# Patient Record
Sex: Female | Born: 1975 | Race: White | Hispanic: No | Marital: Married | Smoking: Former smoker
Health system: Southern US, Community
[De-identification: ages and names within clinical notes are randomized; demographics above are authoritative.]

## PROBLEM LIST (undated history)

## (undated) DIAGNOSIS — J45909 Unspecified asthma, uncomplicated: Secondary | ICD-10-CM

## (undated) HISTORY — PX: ANKLE SURGERY: SHX546

---

## 2001-07-11 ENCOUNTER — Other Ambulatory Visit: Admission: RE | Admit: 2001-07-11 | Discharge: 2001-07-11 | Payer: Self-pay | Admitting: *Deleted

## 2003-10-21 ENCOUNTER — Other Ambulatory Visit: Admission: RE | Admit: 2003-10-21 | Discharge: 2003-10-21 | Payer: Self-pay | Admitting: Gynecology

## 2005-04-28 ENCOUNTER — Emergency Department: Payer: Self-pay | Admitting: Emergency Medicine

## 2006-03-10 ENCOUNTER — Inpatient Hospital Stay (HOSPITAL_COMMUNITY): Admission: AD | Admit: 2006-03-10 | Discharge: 2006-03-12 | Payer: Self-pay | Admitting: *Deleted

## 2009-09-11 ENCOUNTER — Ambulatory Visit: Payer: Self-pay | Admitting: Specialist

## 2009-09-18 ENCOUNTER — Ambulatory Visit: Payer: Self-pay | Admitting: Specialist

## 2015-06-16 ENCOUNTER — Encounter: Payer: Self-pay | Admitting: Emergency Medicine

## 2015-06-16 ENCOUNTER — Emergency Department: Payer: BLUE CROSS/BLUE SHIELD

## 2015-06-16 ENCOUNTER — Inpatient Hospital Stay: Payer: BLUE CROSS/BLUE SHIELD

## 2015-06-16 ENCOUNTER — Inpatient Hospital Stay
Admission: EM | Admit: 2015-06-16 | Discharge: 2015-06-19 | DRG: 190 | Disposition: A | Discharge status: Home Health | Payer: BLUE CROSS/BLUE SHIELD | Attending: Internal Medicine | Admitting: Internal Medicine

## 2015-06-16 DIAGNOSIS — R0602 Shortness of breath: Secondary | ICD-10-CM

## 2015-06-16 DIAGNOSIS — J440 Chronic obstructive pulmonary disease with acute lower respiratory infection: Principal | ICD-10-CM | POA: Diagnosis present

## 2015-06-16 DIAGNOSIS — J441 Chronic obstructive pulmonary disease with (acute) exacerbation: Secondary | ICD-10-CM | POA: Diagnosis present

## 2015-06-16 DIAGNOSIS — Z794 Long term (current) use of insulin: Secondary | ICD-10-CM

## 2015-06-16 DIAGNOSIS — J9601 Acute respiratory failure with hypoxia: Secondary | ICD-10-CM | POA: Diagnosis present

## 2015-06-16 DIAGNOSIS — F17210 Nicotine dependence, cigarettes, uncomplicated: Secondary | ICD-10-CM | POA: Diagnosis present

## 2015-06-16 DIAGNOSIS — Z23 Encounter for immunization: Secondary | ICD-10-CM | POA: Diagnosis not present

## 2015-06-16 DIAGNOSIS — J690 Pneumonitis due to inhalation of food and vomit: Secondary | ICD-10-CM

## 2015-06-16 DIAGNOSIS — J209 Acute bronchitis, unspecified: Secondary | ICD-10-CM | POA: Diagnosis present

## 2015-06-16 DIAGNOSIS — J9600 Acute respiratory failure, unspecified whether with hypoxia or hypercapnia: Secondary | ICD-10-CM | POA: Diagnosis present

## 2015-06-16 DIAGNOSIS — J45909 Unspecified asthma, uncomplicated: Secondary | ICD-10-CM | POA: Diagnosis present

## 2015-06-16 DIAGNOSIS — J189 Pneumonia, unspecified organism: Secondary | ICD-10-CM | POA: Diagnosis present

## 2015-06-16 DIAGNOSIS — Z79899 Other long term (current) drug therapy: Secondary | ICD-10-CM | POA: Diagnosis not present

## 2015-06-16 DIAGNOSIS — E1165 Type 2 diabetes mellitus with hyperglycemia: Secondary | ICD-10-CM | POA: Diagnosis present

## 2015-06-16 HISTORY — DX: Unspecified asthma, uncomplicated: J45.909

## 2015-06-16 LAB — BASIC METABOLIC PANEL
Anion gap: 12 (ref 5–15)
BUN: 9 mg/dL (ref 6–20)
CALCIUM: 9.1 mg/dL (ref 8.9–10.3)
CO2: 24 mmol/L (ref 22–32)
CREATININE: 0.47 mg/dL (ref 0.44–1.00)
Chloride: 102 mmol/L (ref 101–111)
GFR calc non Af Amer: >60 mL/min (ref >60)
Glucose, Bld: 256 mg/dL — ABNORMAL HIGH (ref 65–99)
Potassium: 3.6 mmol/L (ref 3.5–5.1)
SODIUM: 138 mmol/L (ref 135–145)

## 2015-06-16 LAB — BLOOD GAS, ARTERIAL
Acid-base deficit: 0 mmol/L (ref 0.0–2.0)
Allens test (pass/fail): POSITIVE — AB
Bicarbonate: 23.9 mEq/L (ref 21.0–28.0)
FIO2: 0.44
O2 SAT: 92.4 %
PATIENT TEMPERATURE: 37
PCO2 ART: 36 mmHg (ref 32.0–48.0)
pH, Arterial: 7.43 (ref 7.350–7.450)
pO2, Arterial: 63 mmHg — ABNORMAL LOW (ref 83.0–108.0)

## 2015-06-16 LAB — CBC
HCT: 42.7 % (ref 35.0–47.0)
Hemoglobin: 14.5 g/dL (ref 12.0–16.0)
MCH: 29.8 pg (ref 26.0–34.0)
MCHC: 33.9 g/dL (ref 32.0–36.0)
MCV: 87.9 fL (ref 80.0–100.0)
PLATELETS: 248 10*3/uL (ref 150–440)
RBC: 4.86 MIL/uL (ref 3.80–5.20)
RDW: 12.7 % (ref 11.5–14.5)
WBC: 10.9 10*3/uL (ref 3.6–11.0)

## 2015-06-16 LAB — GLUCOSE, CAPILLARY: Glucose-Capillary: 305 mg/dL — ABNORMAL HIGH (ref 65–99)

## 2015-06-16 LAB — INFLUENZA PANEL BY PCR (TYPE A & B)
H1N1FLUPCR: NOT DETECTED
INFLAPCR: NEGATIVE
Influenza B By PCR: NEGATIVE

## 2015-06-16 LAB — TROPONIN I

## 2015-06-16 LAB — TSH: TSH: 1.696 u[IU]/mL (ref 0.350–4.500)

## 2015-06-16 LAB — MRSA PCR SCREENING: MRSA BY PCR: NEGATIVE

## 2015-06-16 MED ORDER — GUAIFENESIN ER 600 MG PO TB12
600.0000 mg | ORAL_TABLET | Freq: Two times a day (BID) | ORAL | Status: DC
  Administered 2015-06-16 – 2015-06-19 (×7): 600 mg via ORAL
  Filled 2015-06-16 (×7): qty 1

## 2015-06-16 MED ORDER — ONDANSETRON HCL 4 MG PO TABS: 4.0000 mg | ORAL_TABLET | Freq: Four times a day (QID) | ORAL | Status: DC | PRN

## 2015-06-16 MED ORDER — IPRATROPIUM-ALBUTEROL 0.5-2.5 (3) MG/3ML IN SOLN
3.0000 mL | Freq: Once | RESPIRATORY_TRACT | Status: AC
  Administered 2015-06-16: 3 mL via RESPIRATORY_TRACT
  Filled 2015-06-16: qty 3

## 2015-06-16 MED ORDER — ACETAMINOPHEN 325 MG PO TABS: 650.0000 mg | ORAL_TABLET | Freq: Four times a day (QID) | ORAL | Status: DC | PRN

## 2015-06-16 MED ORDER — DEXTROSE 5 % IV SOLN
1.0000 g | Freq: Once | INTRAVENOUS | Status: AC
  Administered 2015-06-16: 1 g via INTRAVENOUS
  Filled 2015-06-16: qty 10

## 2015-06-16 MED ORDER — ACETAMINOPHEN 650 MG RE SUPP: 650.0000 mg | Freq: Four times a day (QID) | RECTAL | Status: DC | PRN

## 2015-06-16 MED ORDER — MOMETASONE FURO-FORMOTEROL FUM 100-5 MCG/ACT IN AERO
2.0000 | INHALATION_SPRAY | Freq: Two times a day (BID) | RESPIRATORY_TRACT | Status: DC
  Administered 2015-06-16 – 2015-06-19 (×7): 2 via RESPIRATORY_TRACT
  Filled 2015-06-16: qty 8.8

## 2015-06-16 MED ORDER — HYDROCODONE-ACETAMINOPHEN 5-325 MG PO TABS: 1.0000 | ORAL_TABLET | ORAL | Status: DC | PRN

## 2015-06-16 MED ORDER — ACETYLCYSTEINE 20 % IN SOLN: 4.0000 mL | Freq: Two times a day (BID) | RESPIRATORY_TRACT | Status: DC

## 2015-06-16 MED ORDER — NICOTINE 10 MG IN INHA
1.0000 | RESPIRATORY_TRACT | Status: DC | PRN
  Filled 2015-06-16: qty 36

## 2015-06-16 MED ORDER — TIOTROPIUM BROMIDE MONOHYDRATE 18 MCG IN CAPS
18.0000 ug | ORAL_CAPSULE | Freq: Every day | RESPIRATORY_TRACT | Status: DC
  Administered 2015-06-17: 18 ug via RESPIRATORY_TRACT
  Filled 2015-06-16: qty 5

## 2015-06-16 MED ORDER — DEXTROSE 5 % IV SOLN
1.0000 g | INTRAVENOUS | Status: DC
  Filled 2015-06-16: qty 10

## 2015-06-16 MED ORDER — ALBUTEROL SULFATE (2.5 MG/3ML) 0.083% IN NEBU
5.0000 mg | INHALATION_SOLUTION | Freq: Once | RESPIRATORY_TRACT | Status: AC
  Administered 2015-06-16: 5 mg via RESPIRATORY_TRACT
  Filled 2015-06-16: qty 6

## 2015-06-16 MED ORDER — SODIUM CHLORIDE 0.9 % IV SOLN
INTRAVENOUS | Status: DC
  Administered 2015-06-16: 75 mL/h via INTRAVENOUS
  Administered 2015-06-17: 08:00:00 via INTRAVENOUS

## 2015-06-16 MED ORDER — ENOXAPARIN SODIUM 40 MG/0.4ML ~~LOC~~ SOLN
40.0000 mg | SUBCUTANEOUS | Status: DC
  Administered 2015-06-16 – 2015-06-18 (×3): 40 mg via SUBCUTANEOUS
  Filled 2015-06-16 (×3): qty 0.4

## 2015-06-16 MED ORDER — INFLUENZA VAC SPLIT QUAD 0.5 ML IM SUSY
0.5000 mL | PREFILLED_SYRINGE | INTRAMUSCULAR | Status: AC
  Administered 2015-06-17: 0.5 mL via INTRAMUSCULAR
  Filled 2015-06-16: qty 0.5

## 2015-06-16 MED ORDER — METHYLPREDNISOLONE SODIUM SUCC 125 MG IJ SOLR
125.0000 mg | Freq: Once | INTRAMUSCULAR | Status: AC
  Administered 2015-06-16: 125 mg via INTRAVENOUS
  Filled 2015-06-16: qty 2

## 2015-06-16 MED ORDER — IOHEXOL 350 MG/ML SOLN
75.0000 mL | Freq: Once | INTRAVENOUS | Status: AC | PRN
  Administered 2015-06-16: 75 mL via INTRAVENOUS

## 2015-06-16 MED ORDER — GUAIFENESIN-DM 100-10 MG/5ML PO SYRP: 5.0000 mL | ORAL_SOLUTION | ORAL | Status: DC | PRN

## 2015-06-16 MED ORDER — LEVALBUTEROL HCL 0.63 MG/3ML IN NEBU
0.6300 mg | INHALATION_SOLUTION | Freq: Four times a day (QID) | RESPIRATORY_TRACT | Status: DC
  Administered 2015-06-16 – 2015-06-19 (×12): 0.63 mg via RESPIRATORY_TRACT
  Filled 2015-06-16 (×16): qty 3

## 2015-06-16 MED ORDER — AZITHROMYCIN 250 MG PO TABS
500.0000 mg | ORAL_TABLET | Freq: Once | ORAL | Status: AC
  Administered 2015-06-16: 500 mg via ORAL
  Filled 2015-06-16: qty 2

## 2015-06-16 MED ORDER — AZITHROMYCIN 250 MG PO TABS
500.0000 mg | ORAL_TABLET | Freq: Every day | ORAL | Status: DC
  Administered 2015-06-17 – 2015-06-19 (×3): 500 mg via ORAL
  Filled 2015-06-16 (×3): qty 2

## 2015-06-16 MED ORDER — METHYLPREDNISOLONE SODIUM SUCC 125 MG IJ SOLR
60.0000 mg | Freq: Four times a day (QID) | INTRAMUSCULAR | Status: DC
  Administered 2015-06-16 – 2015-06-17 (×3): 60 mg via INTRAVENOUS
  Filled 2015-06-16 (×4): qty 2

## 2015-06-16 MED ORDER — ONDANSETRON HCL 4 MG/2ML IJ SOLN: 4.0000 mg | Freq: Four times a day (QID) | INTRAMUSCULAR | Status: DC | PRN

## 2015-06-16 NOTE — Consult Note (Signed)
[facility] [place] [facility] Consultation     ASSESSMENT/PLAN    PULMONARY -Acute respiratory failure secondary to acute exacerbation of asthma. We will treat with IV steroids and inhaled nebulizer treatments. -Pneumonia. -Likely episode of acute bronchitis. We'll treat with azithromycin. -Likely element of allergies. Given the patient has a new dog, recommended that the PET be removed from the bedroom environment and the patient be started on an antihistamine. -We'll require an inhaled steroid at the time of discharge, flu vaccine, pneumococcal Pneumovax, and follow-up with the pulmonary.   INFECTIOUS -Likely element of acute bronchitis. P:   -We'll continue ceftriaxone and azithromycin, both started 11-1. BCx2 11/1: Pending Mycoplasma IgM: 11/1 Pending Streptococcal urinary antigen: 11/1 pending Influenza swab: 11/1. Pending Sputum pending    ---------------------------------------  ---------------------------------------   Name: Gina Guzman MRN: [id number] DOB: 1976-03-07    ADMISSION DATE:  06/16/2015 CONSULTATION DATE:  06/16/2015  REFERRING MD :  Dr. Allena Katz.   CHIEF COMPLAINT:  Dyspnea.    HISTORY OF PRESENT ILLNESS:    The patient is a 39 year old Caucasian female with a remote history of asthma several years ago but went away as an adult. The patient has a history of smoking. She smokes currently one pack a day. She is she has quit twice in the past when she was pregnant but restarted smoking again after she had had children. She had no problems with asthma since she was a child. The patient and noted approximately 2 months ago she started developing some dyspnea with cough and congestion. She presented to a physician and received a course of antibiotics, she noted that she had significant improvement, though she did not completely returned to baseline. The symptoms recurred again over the last 2-3 weeks and admitted. She was noting them to progress.  Subsequently she presented to the emergency room with dyspnea. She was started on 2 L nasal cannula and noted that her sats were still 89%, subsequently she was increased to 4 L with sats of 91%. It was noted that she was wheezing. She was then admitted to the intensive care unit due to respiratory distress, the patient was requiring 40-50% via Ventimask. Currently, she appears to be relatively comfortable. She has mild dyspnea, but is speaking in full sentences without conversational dyspnea. She notes that her breathing is still tight. She complains of no chest pain, she has had a history of right upper quadrant pain which she attributes to gallbladder issues, she says she has had this pain for approximately one year now with no particular exacerbating or relieving factors that she does think it is made worse by eating. She notes that the symptoms started probably 2 months ago when this occurred approximately 2-3 weeks after getting a new dog at home. They currently have 2 dogs at home. One is older and lives primarily in the downstairs area, a newer puppy is all over the house and sleeps in the bedroom and often in bed her.  The patient is also a smoker of 1 pack sig as per day. She notes no history of electronic cigarette use or other illicit drug use or other smoking behaviors.   PAST MEDICAL HISTORY :  Past Medical History  Diagnosis Date  . Asthma    Past Surgical History  Procedure Laterality Date  . Cesarean section      x2  . Ankle surgery Left    Prior to Admission medications   Medication Sig Start Date End Date Taking? Authorizing Provider  albuterol (PROAIR HFA) 108 (90 BASE) MCG/ACT inhaler Inhale 2 puffs into the lungs every 6 (six) hours as needed for wheezing.    Yes Historical Provider, MD  Pseudoephedrine-Naproxen Na 120-220 MG TB12 Take 1 tablet by mouth daily.   Yes Historical Provider, MD   No Known Allergies  FAMILY HISTORY:  Family History  Problem Relation Age of  Onset  . COPD Father    SOCIAL HISTORY:  reports that she has been smoking.  She does not have any smokeless tobacco history on file. She reports that she does not drink alcohol. Her drug history is not on file.  REVIEW OF SYSTEMS:   Constitutional: Feels well. Cardiovascular: No chest pain.  Pulmonary: Denies dyspnea.   The remainder of systems were reviewed and were found to be negative other than what is documented in the HPI.    VITAL SIGNS: Temp:  [97.8 F (36.6 C)] 97.8 F (36.6 C) (11/01 0835) Pulse Rate:  [94-109] 108 (11/01 1300) Resp:  [20-26] 21 (11/01 1300) BP: (116-144)/(70-90) 130/72 mmHg (11/01 1300) SpO2:  [84 %-89 %] 87 % (11/01 1300) Weight:  [94.348 kg (208 lb)] 94.348 kg (208 lb) (11/01 0835) HEMODYNAMICS:   VENTILATOR SETTINGS:   INTAKE / OUTPUT: No intake or output data in the 24 hours ending 06/16/15 1535  Physical Examination:   VS: BP 130/72 mmHg  Pulse 108  Temp(Src) 97.8 F (36.6 C) (Oral)  Resp 21  Ht 5\' 8"  (1.727 m)  Wt 94.348 kg (208 lb)  BMI 31.63 kg/m2  SpO2 87%  LMP  (LMP Unknown)  General Appearance: No distress  Neuro:without focal findings, mental status, speech normal, alert and oriented, cranial nerves 2-12 intact, reflexes normal and symmetric, sensation grossly normal  HEENT: PERRLA, EOM intact, no ptosis, no other lesions noticed;  Pulmonary: normal breath sounds., diaphragmatic excursion normal.No wheezing, No rales;     CardiovascularNormal S1,S2.  No m/r/g.    Abdomen: Benign, Soft, non-tender, No masses, hepatosplenomegaly, No lymphadenopathy Renal:  No costovertebral tenderness  GU:  Not performed at this time. Endoc: No evident thyromegaly, no signs of acromegaly. Skin:   warm, no rashes, no ecchymosis  Extremities: normal, no cyanosis, clubbing, no edema, warm with normal capillary refill.    LABS: Reviewed   LABORATORY PANEL:   CBC  Recent Labs Lab 06/16/15 1011  WBC 10.9  HGB 14.5  HCT 42.7  PLT 248      Chemistries   Recent Labs Lab 06/16/15 1011  NA 138  K 3.6  CL 102  CO2 24  GLUCOSE 256*  BUN 9  CREATININE 0.47  CALCIUM 9.1     Recent Labs Lab 06/16/15 1458  GLUCAP 305*    Recent Labs Lab 06/16/15 1315  PHART 7.43  PCO2ART 36  PO2ART 63*   No results for input(s): AST, ALT, ALKPHOS, BILITOT, ALBUMIN in the last 168 hours.  Cardiac Enzymes  Recent Labs Lab 06/16/15 1011  TROPONINI <0.03    RADIOLOGY:  Dg Chest 2 View  06/16/2015  CLINICAL DATA:  Feeling bad for the past 6 weeks. Intermittent low saturations. History of smoking. EXAM: CHEST  2 VIEW COMPARISON:  None. FINDINGS: Normal cardiac silhouette and mediastinal contours. Veiling opacities overlying the bilateral lower lungs favored to represent lying breast tissues. The lungs appear mildly hyperexpanded with mild diffuse slightly nodular thickening of the pulmonary interstitium. Minimal bibasilar linear heterogeneous opacities, left greater than right, likely atelectasis or scar. No discrete focal airspace opacities. No pleural effusion or  pneumothorax. No evidence of edema. No acute osseus abnormalities. IMPRESSION: Mild lung hyper expansion and bronchitic change without acute cardiopulmonary disease. Electronically Signed   By: Simonne Come M.D.   On: 06/16/2015 10:50   Ct Angio Chest Pe W/cm &/or Wo Cm  06/16/2015  CLINICAL DATA:  Asthma and progressive shortness of breath. EXAM: CT ANGIOGRAPHY CHEST WITH CONTRAST TECHNIQUE: Multidetector CT imaging of the chest was performed using the standard protocol during bolus administration of intravenous contrast. Multiplanar CT image reconstructions and MIPs were obtained to evaluate the vascular anatomy. CONTRAST:  75mL OMNIPAQUE IOHEXOL 350 MG/ML SOLN COMPARISON:  Chest x-ray earlier today. FINDINGS: Patchy areas of ground-glass airspace opacity in both upper lobes, the right middle lobe and lingula as well as mild involvement at the lung bases in the lower  lobes bilaterally is suggestive of multifocal pneumonia. No edema, nodule, pneumothorax or pleural effusion is identified. No evidence of airway obstruction. No pericardial fluid is seen. The heart size is normal. The pulmonary arteries are well opacified and there is no evidence of pulmonary embolism. The thoracic aorta is normal. Visualized upper abdominal structures show probable hepatic steatosis. Bony structures are unremarkable. Review of the MIP images confirms the above findings. IMPRESSION: Patchy ground-glass infiltrates bilaterally predominantly and upper and mid lung zones and consistent with multifocal pneumonia. No associated edema, pleural fluid or pneumothorax. No pulmonary embolism. Electronically Signed   By: Irish Lack M.D.   On: 06/16/2015 12:53       --Wells Guiles, MD.  Board Certified in Internal Medicine, Pulmonary Medicine, Critical Care Medicine, and Sleep Medicine.  Pager [phone] [facility] Office Number: [phone]  Santiago Glad, M.D.  Stephanie Acre, M.D.  Billy Fischer, M.D   06/16/2015, 3:35 PM

## 2015-06-16 NOTE — ED Notes (Signed)
Sats at 96% during breathing treatment.

## 2015-06-16 NOTE — Progress Notes (Signed)
O2 tubing added to enable patient to use BSC.

## 2015-06-16 NOTE — ED Notes (Signed)
Pt's O2 84%, pt placed on 5L of O2 via [place].

## 2015-06-16 NOTE — H&P (Signed)
[facility] - [place] at [facility]   PATIENT NAME: Gina Guzman    MR#:  [id number]  DATE OF BIRTH:  12-Jul-1976  DATE OF ADMISSION:  06/16/2015  PRIMARY CARE PHYSICIAN: No primary care provider on file.   REQUESTING/REFERRING PHYSICIAN: Adelene Amas M.D  CHIEF COMPLAINT:   Chief Complaint  Patient presents with  . Shortness of Breath    HISTORY OF PRESENT ILLNESS: Gina Guzman  is a 39 y.o. female with a known history of asthma as a child who has had progressive shortness of breath for the past few weeks. She was seen in walk-in clinic and was started on some antibiotics, inhalers and antitussives. Patient's breathing improved shortly but then breathing got worse. Comes to the emergency room which shortness of breath hypoxia requiring oxygen. Chest x-ray suggestive of COPD changes. Patient has a smoking history of 20 pack years. She complains of dry cough and wheezing. She also has been having's sharp pain below her right breast ongoing for the past few months. Denies any fevers or chills. Chronic intermittent left ankle swelling related to a previous ankle fracture.       PAST MEDICAL HISTORY:   Past Medical History  Diagnosis Date  . Asthma     PAST SURGICAL HISTORY:  Past Surgical History  Procedure Laterality Date  . Cesarean section      x2  . Ankle surgery Left     SOCIAL HISTORY:  Social History  Substance Use Topics  . Smoking status: Current Every Day Smoker  . Smokeless tobacco: Not on file     Comment: 4 min spent recommended to stop  . Alcohol Use: No    FAMILY HISTORY:  Family History  Problem Relation Age of Onset  . COPD Father     DRUG ALLERGIES: No Known Allergies  REVIEW OF SYSTEMS:   CONSTITUTIONAL: No fever, fatigue or weakness.  EYES: No blurred or double vision.  EARS, NOSE, AND THROAT: No tinnitus or ear pain.  RESPIRATORY: No cough,  positiveshortness of breath and wheezing , no  hemoptysis.   CARDIOVASCULAR: No chest pain, orthopnea, edema.  GASTROINTESTINAL: No nausea, vomiting, diarrhea or abdominal pain.  GENITOURINARY: No dysuria, hematuria.  ENDOCRINE: No polyuria, nocturia,  HEMATOLOGY: No anemia, easy bruising or bleeding SKIN: No rash or lesion. MUSCULOSKELETAL: No joint pain or arthritis.   NEUROLOGIC: No tingling, numbness, weakness.  PSYCHIATRY: No anxiety or depression.   MEDICATIONS AT HOME:  Prior to Admission medications   Medication Sig Start Date End Date Taking? Authorizing Provider  albuterol (PROAIR HFA) 108 (90 BASE) MCG/ACT inhaler Inhale 2 puffs into the lungs every 6 (six) hours as needed for wheezing.    Yes Historical Provider, MD  Pseudoephedrine-Naproxen Na 120-220 MG TB12 Take 1 tablet by mouth daily.   Yes Historical Provider, MD      PHYSICAL EXAMINATION:   VITAL SIGNS: Blood pressure 125/86, pulse 106, temperature 97.8 F (36.6 C), temperature source Oral, resp. rate 22, height  (1.727 m), weight 94.348 kg (208 lb), SpO2 84 %.  GENERAL:  39 y.o.-year-old patient lying in the bed with no acute distress.  EYES: Pupils equal, round, reactive to light and accommodation. No scleral icterus. Extraocular muscles intact.  HEENT: Head atraumatic, normocephalic. Oropharynx and nasopharynx clear.  NECK:  Supple, no jugular venous distention. No thyroid enlargement, no tenderness.  LUNGS: bilateral accessory muscle usage, diffuse wheezing no crackles or rhonchi    CARDIOVASCULAR: S1, S2 normal. No murmurs, rubs, or  gallops.  ABDOMEN: Soft, nontender, nondistended. Bowel sounds present. No organomegaly or mass.  EXTREMITIES: Left ankle swelling   NEUROLOGIC: Cranial nerves II through XII are intact. Muscle strength 5/5 in all extremities. Sensation intact. Gait not checked.  PSYCHIATRIC: The patient is alert and oriented x 3.  SKIN: No obvious rash, lesion, or ulcer.   LABORATORY PANEL:   CBC  Recent Labs Lab 06/16/15 1011  WBC 10.9   HGB 14.5  HCT 42.7  PLT 248  MCV 87.9  MCH 29.8  MCHC 33.9  RDW 12.7   ------------------------------------------------------------------------------------------------------------------  Chemistries   Recent Labs Lab 06/16/15 1011  NA 138  K 3.6  CL 102  CO2 24  GLUCOSE 256*  BUN 9  CREATININE 0.47  CALCIUM 9.1   ------------------------------------------------------------------------------------------------------------------ estimated creatinine clearance is 111.2 mL/min (by C-G formula based on Cr of 0.47). ------------------------------------------------------------------------------------------------------------------ No results for input(s): TSH, T4TOTAL, T3FREE, THYROIDAB in the last 72 hours.  Invalid input(s): FREET3   Coagulation profile No results for input(s): INR, PROTIME in the last 168 hours. ------------------------------------------------------------------------------------------------------------------- No results for input(s): DDIMER in the last 72 hours. -------------------------------------------------------------------------------------------------------------------  Cardiac Enzymes  Recent Labs Lab 06/16/15 1011  TROPONINI <0.03   ------------------------------------------------------------------------------------------------------------------ Invalid input(s): POCBNP  ---------------------------------------------------------------------------------------------------------------  Urinalysis No results found for: COLORURINE, APPEARANCEUR, LABSPEC, PHURINE, GLUCOSEU, HGBUR, BILIRUBINUR, KETONESUR, PROTEINUR, UROBILINOGEN, NITRITE, LEUKOCYTESUR   RADIOLOGY: Dg Chest 2 View  06/16/2015  CLINICAL DATA:  Feeling bad for the past 6 weeks. Intermittent low saturations. History of smoking. EXAM: CHEST  2 VIEW COMPARISON:  None. FINDINGS: Normal cardiac silhouette and mediastinal contours. Veiling opacities overlying the bilateral lower lungs  favored to represent lying breast tissues. The lungs appear mildly hyperexpanded with mild diffuse slightly nodular thickening of the pulmonary interstitium. Minimal bibasilar linear heterogeneous opacities, left greater than right, likely atelectasis or scar. No discrete focal airspace opacities. No pleural effusion or pneumothorax. No evidence of edema. No acute osseus abnormalities. IMPRESSION: Mild lung hyper expansion and bronchitic change without acute cardiopulmonary disease. Electronically Signed   By: Simonne ComeJohn  Watts M.D.   On: 06/16/2015 10:50    EKG: Orders placed or performed during the hospital encounter of 06/16/15  . ED EKG  . ED EKG    IMPRESSION AND PLAN:  patient is a [age] year old  white female with history of nicotine addiction presents with shortness of breath  1. Acute hypoxic respiratory failure: Suspect due to underlying COPD with acute exasperation, I will treat her with Xopenex neb due to heart rate being elevated. Start her on IV Solu-Medrol and empiric antibiotics for acute bronchitis. I will also start her on Spiriva and add dulera.  2. Right sided chest pain: With her requiring 4-5 L of oxygen need to make sure she does not have a pulmonary embolism will get a CT of the chest. Also the CT scan is negative then consider right upper quadrant ultrasound to rule out gallbladder disease but that needs to be done at a later point.  3. Nicotine addiction smoking cessation provided 4 minutes spent strongly recommended patient stop smoking she is not interested in a nicotine patch but I will place her on a nicotine inhaler as needed     All the records are reviewed and case discussed with ED provider. Management plans discussed with the patient, family and they are in agreement.  CODE STATUS: full     TOTAL TIME TAKING CARE OF THIS PATI1555minutes.    Auburn BilberryPATEL, Salomon Ganser M.D on 06/16/2015 at 11:47 AM  Between 7am  to 6pm - Pager - [phone]  After 6pm go to [email or web address]  - password [email or web address]  [facility]  Office  [phone]  CC: Primary care physician; No primary care provider on file.

## 2015-06-16 NOTE — ED Notes (Signed)
[facility] same 4 weeks ago. Patient reports shortness of breath currently, with having to sleep in recliner. Patient with +wheezing. Reports asthma as a child, but none currently.

## 2015-06-16 NOTE — ED Notes (Signed)
MD notified of pt's being given all of her meds, and her O2 sats remaining 85-90% on 5L of O2.

## 2015-06-16 NOTE — ED Notes (Signed)
Patient was placed on 2L O2 upon arrival to exam room via nasal cannula. Patient's sats did not improve and remained at 89%. Patient's O2 increased to 4L [place].

## 2015-06-16 NOTE — ED Provider Notes (Signed)
[facility] Emergency [facility] Provider Note  Time seen: 9:09 AM  I have reviewed the triage vital signs and the nursing notes.   HISTORY  Chief Complaint Shortness of Breath    HPI Gina Guzman is a 39 y.o. female with no past medical history who presents the emergency department difficulty breathing. According to the patient for the past 4 weeks she's had mild shortness of breath, and cough. Over the last 2 days it has worsened significantly. Patient states a history of asthma as a child, but denies any history as an adult. Does not take any asthma medications. Patient does smoke one pack of cigarettes per day, and has done so for greater than 20 years. Denies chest pain. States significant cough, worse at night, worse when lying flat, moderate amount of sputum. Denies fever.     History reviewed. No pertinent past medical history.  There are no active problems to display for this patient.   Past Surgical History  Procedure Laterality Date  . Cesarean section      x2  . Ankle surgery Left     No current outpatient prescriptions on file.  Allergies Review of patient's allergies indicates no known allergies.  No family history on file.  Social History Social History  Substance Use Topics  . Smoking status: Current Every Day Smoker  . Smokeless tobacco: None  . Alcohol Use: No    Review of Systems Constitutional: Negative for fever. Cardiovascular: Negative for chest pain. Respiratory: Moderate shortness of breath, positive for cough. Gastrointestinal: Negative for abdominal pain, vomiting and diarrhea. Neurological: Negative for headache 10-point ROS otherwise negative.  ____________________________________________   PHYSICAL EXAM:  VITAL SIGNS: ED Triage Vitals  Enc Vitals Group     BP 06/16/15 0835 144/90 mmHg     Pulse Rate 06/16/15 0835 101     Resp 06/16/15 0835 26     Temp 06/16/15 0835 97.8 F (36.6 C)     Temp  Source 06/16/15 0835 Oral     SpO2 06/16/15 0835 89 %     Weight 06/16/15 0835 208 lb (94.348 kg)     Height 06/16/15 0835 5\' 8"  (1.727 m)     Head Cir --      Peak Flow --      Pain Score 06/16/15 0838 7     Pain Loc --      Pain Edu? --      Excl. in GC? --    Constitutional: Alert and oriented. Well appearing and in no distress. Eyes: Normal exam ENT   Head: Normocephalic and atraumatic.   Mouth/Throat: Mucous membranes are moist. Cardiovascular: Normal rate, regular rhythm. No murmur Respiratory: Mild tachypnea, moderate expiratory wheeze bilaterally. No rales or rhonchi. Gastrointestinal: Soft and nontender. No distention.   Musculoskeletal: Nontender with normal range of motion in all extremities. No lower extremity tenderness or edema. Neurologic:  Normal speech and language. No gross focal neurologic deficits  Psychiatric: Mood and affect are normal. Speech and behavior are normal.  ____________________________________________    EKG  EKG reviewed and interpreted by myself shows normal sinus rhythm at 98 bpm, narrow QRS, normal axis, normal intervals, no ST changes noted. Overall normal EKG.  ____________________________________________    RADIOLOGY  Chest x-ray shows no pneumonia.  ____________________________________________   INITIAL IMPRESSION / ASSESSMENT AND PLAN / ED COURSE  Pertinent labs & imaging results that were available during my care of the patient were reviewed by me and considered in my  medical decision making (see chart for details).  Patient with worsening shortness breath, moderate wheeze. Oxygen saturation 89% on 2 L, low 90s on 4 L. No home O2 requirement. Denies any recent asthma issues since childhood. The patient does however smoke one pack of cigarettes per day for greater than 20 years. Likely asthma versus COPD exacerbation. We will continue with breathing treatments, IV Solu-Medrol, chest x-ray, labs and monitor closely in the  emergency department.  Patient with continued wheeze, O2 saturation 84% on room air, increases to 90% on 5 L. Given her continued wheeze, low oxygen saturation, patient will be admitted to the hospital for COPD exacerbation. We will cover with antibiotics and admit.  ____________________________________________   FINAL CLINICAL IMPRESSION(S) / ED DIAGNOSES  Dyspnea COPD exacerbation   Minna Antis, MD 06/16/15 1123

## 2015-06-16 NOTE — Progress Notes (Signed)
RT called to room to assess for hypoxia.  Patient 87% on 6LPM [place], alert and oriented, answering questions appropriately.  ABG drawn then placed patient on 50% VM, O2 sat up to 91%.  Will continue to monitor.

## 2015-06-17 DIAGNOSIS — J189 Pneumonia, unspecified organism: Secondary | ICD-10-CM

## 2015-06-17 LAB — BASIC METABOLIC PANEL
ANION GAP: 8 (ref 5–15)
BUN: 15 mg/dL (ref 6–20)
CHLORIDE: 103 mmol/L (ref 101–111)
CO2: 23 mmol/L (ref 22–32)
Calcium: 9 mg/dL (ref 8.9–10.3)
Creatinine, Ser: 0.57 mg/dL (ref 0.44–1.00)
GFR calc non Af Amer: >60 mL/min (ref >60)
Glucose, Bld: 337 mg/dL — ABNORMAL HIGH (ref 65–99)
Potassium: 4.1 mmol/L (ref 3.5–5.1)
Sodium: 134 mmol/L — ABNORMAL LOW (ref 135–145)

## 2015-06-17 LAB — GLUCOSE, CAPILLARY
GLUCOSE-CAPILLARY: 397 mg/dL — AB (ref 65–99)
GLUCOSE-CAPILLARY: 298 mg/dL — AB (ref 65–99)
GLUCOSE-CAPILLARY: 355 mg/dL — AB (ref 65–99)
GLUCOSE-CAPILLARY: 369 mg/dL — AB (ref 65–99)

## 2015-06-17 LAB — HEMOGLOBIN A1C: Hgb A1c MFr Bld: 11.2 % — ABNORMAL HIGH (ref 4.0–6.0)

## 2015-06-17 LAB — CBC
HEMATOCRIT: 41.4 % (ref 35.0–47.0)
Hemoglobin: 13.9 g/dL (ref 12.0–16.0)
MCH: 29.5 pg (ref 26.0–34.0)
MCHC: 33.6 g/dL (ref 32.0–36.0)
MCV: 87.9 fL (ref 80.0–100.0)
Platelets: 270 10*3/uL (ref 150–440)
RBC: 4.7 MIL/uL (ref 3.80–5.20)
RDW: 12.5 % (ref 11.5–14.5)
WBC: 13.4 10*3/uL — AB (ref 3.6–11.0)

## 2015-06-17 LAB — MYCOPLASMA PNEUMONIAE ANTIBODY, IGM: Mycoplasma pneumo IgM: <770 U/mL (ref 0–769)

## 2015-06-17 MED ORDER — METHYLPREDNISOLONE SODIUM SUCC 40 MG IJ SOLR
40.0000 mg | Freq: Three times a day (TID) | INTRAMUSCULAR | Status: DC
  Administered 2015-06-17 – 2015-06-19 (×6): 40 mg via INTRAVENOUS
  Filled 2015-06-17 (×6): qty 1

## 2015-06-17 MED ORDER — CETYLPYRIDINIUM CHLORIDE 0.05 % MT LIQD
7.0000 mL | Freq: Two times a day (BID) | OROMUCOSAL | Status: DC
  Administered 2015-06-17 – 2015-06-19 (×5): 7 mL via OROMUCOSAL

## 2015-06-17 MED ORDER — INSULIN ASPART 100 UNIT/ML ~~LOC~~ SOLN
0.0000 [IU] | Freq: Every day | SUBCUTANEOUS | Status: DC
  Administered 2015-06-17 – 2015-06-18 (×2): 5 [IU] via SUBCUTANEOUS
  Filled 2015-06-17: qty 5

## 2015-06-17 MED ORDER — INSULIN ASPART 100 UNIT/ML ~~LOC~~ SOLN
0.0000 [IU] | Freq: Three times a day (TID) | SUBCUTANEOUS | Status: DC
  Administered 2015-06-17: 11 [IU] via SUBCUTANEOUS
  Administered 2015-06-17: 20 [IU] via SUBCUTANEOUS
  Administered 2015-06-18: 18:00:00 11 [IU] via SUBCUTANEOUS
  Administered 2015-06-18: 12:00:00 20 [IU] via SUBCUTANEOUS
  Administered 2015-06-18: 09:00:00 15 [IU] via SUBCUTANEOUS
  Administered 2015-06-19: 11:00:00 20 [IU] via SUBCUTANEOUS
  Administered 2015-06-19: 15 [IU] via SUBCUTANEOUS
  Filled 2015-06-17 (×2): qty 15
  Filled 2015-06-17: qty 5
  Filled 2015-06-17: qty 20
  Filled 2015-06-17: qty 11
  Filled 2015-06-17 (×2): qty 20
  Filled 2015-06-17: qty 11

## 2015-06-17 MED ORDER — PIPERACILLIN-TAZOBACTAM 3.375 G IVPB
3.3750 g | Freq: Three times a day (TID) | INTRAVENOUS | Status: DC
  Administered 2015-06-17 – 2015-06-19 (×7): 3.375 g via INTRAVENOUS
  Filled 2015-06-17 (×9): qty 50

## 2015-06-17 NOTE — Progress Notes (Signed)
Patient ID: Gina Guzman, female   DOB: June 03, 1976, 39 y.o.   MRN: [id number] [facility] PROGRESS NOTE  PCP: No primary care provider on file.  HPI/Subjective: Patient breathing a little bit better today. Still with cough and some shortness of breath. Patiently currently on high flow nasal cannula about 48% oxygen.  Objective: Filed Vitals:   06/17/15 1100  BP: 122/67  Pulse: 110  Temp:   Resp: 19    Filed Weights   06/16/15 0835 06/16/15 1430  Weight: 94.348 kg (208 lb) 94.7 kg (208 lb 12.4 oz)    ROS: Review of Systems  Constitutional: Negative for fever and chills.  Eyes: Negative for blurred vision.  Respiratory: Positive for cough and shortness of breath.   Cardiovascular: Negative for chest pain.  Gastrointestinal: Negative for nausea, vomiting, abdominal pain, diarrhea and constipation.  Genitourinary: Negative for dysuria.  Musculoskeletal: Negative for joint pain.  Neurological: Negative for dizziness and headaches.   Exam: Physical Exam  Constitutional: She is oriented to person, place, and time.  HENT:  Nose: No mucosal edema.  Mouth/Throat: No oropharyngeal exudate or posterior oropharyngeal edema.  Eyes: Conjunctivae, EOM and lids are normal. Pupils are equal, round, and reactive to light.  Neck: No JVD present. Carotid bruit is not present. No edema present. No thyroid mass and no thyromegaly present.  Cardiovascular: S1 normal and S2 normal.  Tachycardia present.  Exam reveals no gallop.   No murmur heard. Pulses:      Dorsalis pedis pulses are 2+ on the right side, and 2+ on the left side.  Respiratory: No respiratory distress. She has decreased breath sounds in the right middle field, the right lower field, the left middle field and the left lower field. She has wheezes in the right upper field, the right middle field, the right lower field, the left upper field, the left middle field and the left lower field. She has rhonchi in the  right lower field and the left lower field. She has no rales.  GI: Soft. Bowel sounds are normal. There is no tenderness.  Musculoskeletal:       Right ankle: She exhibits no swelling.       Left ankle: She exhibits no swelling.  Lymphadenopathy:    She has no cervical adenopathy.  Neurological: She is alert and oriented to person, place, and time. No cranial nerve deficit.  Skin: Skin is warm. No rash noted. Nails show no clubbing.  Psychiatric: She has a normal mood and affect.    Data Reviewed: Basic Metabolic Panel:  Recent Labs Lab 06/16/15 1011 06/17/15 0429  NA 138 134*  K 3.6 4.1  CL 102 103  CO2 24 23  GLUCOSE 256* 337*  BUN 9 15  CREATININE 0.47 0.57  CALCIUM 9.1 9.0   CBC:  Recent Labs Lab 06/16/15 1011 06/17/15 0429  WBC 10.9 13.4*  HGB 14.5 13.9  HCT 42.7 41.4  MCV 87.9 87.9  PLT 248 270    CBG:  Recent Labs Lab 06/16/15 1458 06/17/15 1103  GLUCAP 305* 397*    Recent Results (from the past 240 hour(s))  Blood culture (routine x 2)     Status: None (Preliminary result)   Collection Time: 06/16/15 12:18 PM  Result Value Ref Range Status   Specimen Description BLOOD RIGHT ASSIST CONTROL  Final   Special Requests   Final    BOTTLES DRAWN AEROBIC AND ANAEROBIC  2 CC AERO 1CC ANAERO   Culture NO GROWTH 1  DAY  Final   Report Status PENDING  Incomplete  Blood culture (routine x 2)     Status: None (Preliminary result)   Collection Time: 06/16/15 12:18 PM  Result Value Ref Range Status   Specimen Description BLOOD LEFT HAND  Final   Special Requests BOTTLES DRAWN AEROBIC AND ANAEROBIC  1CC  Final   Culture NO GROWTH 1 DAY  Final   Report Status PENDING  Incomplete  MRSA PCR Screening     Status: None   Collection Time: 06/16/15  2:30 PM  Result Value Ref Range Status   MRSA by PCR NEGATIVE NEGATIVE Final    Comment:        The GeneXpert MRSA Assay (FDA approved for NASAL specimens only), is one component of a comprehensive MRSA  colonization surveillance program. It is not intended to diagnose MRSA infection nor to guide or monitor treatment for MRSA infections.      Studies: Dg Chest 2 View  06/16/2015  CLINICAL DATA:  Feeling bad for the past 6 weeks. Intermittent low saturations. History of smoking. EXAM: CHEST  2 VIEW COMPARISON:  None. FINDINGS: Normal cardiac silhouette and mediastinal contours. Veiling opacities overlying the bilateral lower lungs favored to represent lying breast tissues. The lungs appear mildly hyperexpanded with mild diffuse slightly nodular thickening of the pulmonary interstitium. Minimal bibasilar linear heterogeneous opacities, left greater than right, likely atelectasis or scar. No discrete focal airspace opacities. No pleural effusion or pneumothorax. No evidence of edema. No acute osseus abnormalities. IMPRESSION: Mild lung hyper expansion and bronchitic change without acute cardiopulmonary disease. Electronically Signed   By: Simonne Come M.D.   On: 06/16/2015 10:50   Ct Angio Chest Pe W/cm &/or Wo Cm  06/16/2015  CLINICAL DATA:  Asthma and progressive shortness of breath. EXAM: CT ANGIOGRAPHY CHEST WITH CONTRAST TECHNIQUE: Multidetector CT imaging of the chest was performed using the standard protocol during bolus administration of intravenous contrast. Multiplanar CT image reconstructions and MIPs were obtained to evaluate the vascular anatomy. CONTRAST:  75mL OMNIPAQUE IOHEXOL 350 MG/ML SOLN COMPARISON:  Chest x-ray earlier today. FINDINGS: Patchy areas of ground-glass airspace opacity in both upper lobes, the right middle lobe and lingula as well as mild involvement at the lung bases in the lower lobes bilaterally is suggestive of multifocal pneumonia. No edema, nodule, pneumothorax or pleural effusion is identified. No evidence of airway obstruction. No pericardial fluid is seen. The heart size is normal. The pulmonary arteries are well opacified and there is no evidence of pulmonary  embolism. The thoracic aorta is normal. Visualized upper abdominal structures show probable hepatic steatosis. Bony structures are unremarkable. Review of the MIP images confirms the above findings. IMPRESSION: Patchy ground-glass infiltrates bilaterally predominantly and upper and mid lung zones and consistent with multifocal pneumonia. No associated edema, pleural fluid or pneumothorax. No pulmonary embolism. Electronically Signed   By: Irish Lack M.D.   On: 06/16/2015 12:53    Scheduled Meds: . antiseptic oral rinse  7 mL Mouth Rinse BID  . azithromycin  500 mg Oral Daily  . enoxaparin (LOVENOX) injection  40 mg Subcutaneous Q24H  . guaiFENesin  600 mg Oral BID  . insulin aspart  0-20 Units Subcutaneous TID WC  . insulin aspart  0-5 Units Subcutaneous QHS  . levalbuterol  0.63 mg Nebulization Q6H  . methylPREDNISolone (SOLU-MEDROL) injection  40 mg Intravenous Q8H  . mometasone-formoterol  2 puff Inhalation BID  . piperacillin-tazobactam (ZOSYN)  IV  3.375 g Intravenous 3 times per  day   Continuous Infusions: . sodium chloride 75 mL/hr at 06/17/15 0742    Assessment/Plan:  1. Acute respiratory failure with hypoxia. Patient on high flow nasal cannula 48% oxygen. 2. Multifocal pneumonia bilaterally. Not seen on chest x-ray. Seen on CAT scan as groundglass opacities. Patient put on aggressive antibiotics with Zithromax and Zosyn. 3. COPD exacerbation versus asthmatic bronchitis with pneumonia- patient still with lots of bronchospasm and not moving much air. Patient is on Solu-Medrol and nebulizer treatments. 4. Tobacco abuse- smoking cessation counseling done 3 minutes by me. 5. Hyperglycemia- we'll check a hemoglobin A1c. Sliding scale insulin.  Code Status:     Code Status Orders        Start     Ordered   06/16/15 1436  Full code   Continuous     06/16/15 1435     Disposition Plan: home once breathing better  Antibiotics:  Zosyn  Zithromax  Time spent: 25  minutes  Alford Highland  [facility]

## 2015-06-17 NOTE — Progress Notes (Signed)
[facility] Progess [facility]    ASSESSMENT/PLAN    PULMONARY -Acute respiratory failure secondary to acute exacerbation of asthma. We will treat with IV steroids and inhaled nebulizer treatments. -Pneumonia. Given her recent exposure to antibiotics, the ceftriaxone was changed to Zosyn today. -Likely element of allergies. Given the patient has a new dog, recommended that the PET be removed from the bedroom environment and the patient be started on an antihistamine. -We'll require an inhaled steroid at the time of discharge, flu vaccine, pneumococcal Pneumovax, and follow-up with the pulmonary.   INFECTIOUS -Multifocal pneumonia. -Likely element of acute bronchitis. P:  -We'll continue antibiotics. BCx2 11/1: Pending Mycoplasma IgM: 11/1 Pending Streptococcal urinary antigen: 11/1 pending Influenza swab: 11/1. Negative Sputum pending    ENDOCRINE A: Hyperglycemia, likely due to steroids. The patient has a history of gestational diabetes. P:   Continue sliding scale insulin, the patient's steroids were decreased today.   INDWELLING DEVICES::  MICRO DATA: MRSA PCR  Urine  Blood Resp   ANTIMICROBIALS:  Azithromycin 11/1>> Ceftriaxone 11/1>> 11/2 Zosyn 11/1>>   Patient appears stable for transfer to the general medical floor today, pulmonary , will continue to follow. ---------------------------------------   ----------------------------------------   Name: Gina Guzman MRN: [id number] DOB: [birth year]    ADMISSION DATE:  06/16/2015     SUBJECTIVE:   Patient feels her breathing is slightly improved today.  Review of Systems:  Constitutional: Feels well. Cardiovascular: No chest pain.  Pulmonary: Denies dyspnea.   The remainder of systems were reviewed and were found to be negative other than what is documented in the HPI.    VITAL SIGNS: Temp:  [97.7 F (36.5 C)-98.8 F (37.1 C)] 98 F (36.7 C) (11/02 0740) Pulse Rate:   [94-110] 98 (11/02 0600) Resp:  [19-27] 20 (11/02 0600) BP: (102-144)/(54-101) 119/79 mmHg (11/02 0600) SpO2:  [84 %-94 %] 94 % (11/02 0600) FiO2 (%):  [50 %] 50 % (11/02 0216) Weight:  [94.348 kg (208 lb)-94.7 kg (208 lb 12.4 oz)] 94.7 kg (208 lb 12.4 oz) (11/01 1430) HEMODYNAMICS:   VENTILATOR SETTINGS: Vent Mode:  [-]  FiO2 (%):  [50 %] 50 % INTAKE / OUTPUT:  Intake/Output Summary (Last 24 hours) at 06/17/15 [id number] Last data filed at 06/17/15 0600  Gross per 24 hour  Intake 1542.5 ml  Output   1100 ml  Net  442.5 ml    PHYSICAL EXAMINATION: Physical Examination:   VS: BP 119/79 mmHg  Pulse 98  Temp(Src) 98 F (36.7 C) (Oral)  Resp 20  Ht 5\' 8"  (1.727 m)  Wt 94.7 kg (208 lb 12.4 oz)  BMI 31.75 kg/m2  SpO2 94%  LMP  (LMP Unknown)  General Appearance: No distress  Neuro:without focal findings, mental status normal. HEENT: PERRLA, EOM intact. Pulmonary: Scattered bilateral wheezing. CardiovascularNormal S1,S2.  No m/r/g.   Abdomen: Benign, Soft, non-tender. Renal:  No costovertebral tenderness  GU:  Not performed at this time. Endocrine: No evident thyromegaly. Skin:   warm, no rashes, no ecchymosis  Extremities: normal, no cyanosis, clubbing.   LABS:   LABORATORY PANEL:   CBC  Recent Labs Lab 06/17/15 0429  WBC 13.4*  HGB 13.9  HCT 41.4  PLT 270    Chemistries   Recent Labs Lab 06/17/15 0429  NA 134*  K 4.1  CL 103  CO2 23  GLUCOSE 337*  BUN 15  CREATININE 0.57  CALCIUM 9.0     Recent Labs Lab 06/16/15 1458  GLUCAP 305*  Recent Labs Lab 06/16/15 1315  PHART 7.43  PCO2ART 36  PO2ART 63*   No results for input(s): AST, ALT, ALKPHOS, BILITOT, ALBUMIN in the last 168 hours.  Cardiac Enzymes  Recent Labs Lab 06/16/15 1011  TROPONINI <0.03    RADIOLOGY:  Dg Chest 2 View  06/16/2015  CLINICAL DATA:  Feeling bad for the past 6 weeks. Intermittent low saturations. History of smoking. EXAM: CHEST  2 VIEW COMPARISON:  None.  FINDINGS: Normal cardiac silhouette and mediastinal contours. Veiling opacities overlying the bilateral lower lungs favored to represent lying breast tissues. The lungs appear mildly hyperexpanded with mild diffuse slightly nodular thickening of the pulmonary interstitium. Minimal bibasilar linear heterogeneous opacities, left greater than right, likely atelectasis or scar. No discrete focal airspace opacities. No pleural effusion or pneumothorax. No evidence of edema. No acute osseus abnormalities. IMPRESSION: Mild lung hyper expansion and bronchitic change without acute cardiopulmonary disease. Electronically Signed   By: Simonne Come M.D.   On: 06/16/2015 10:50   Ct Angio Chest Pe W/cm &/or Wo Cm  06/16/2015  CLINICAL DATA:  Asthma and progressive shortness of breath. EXAM: CT ANGIOGRAPHY CHEST WITH CONTRAST TECHNIQUE: Multidetector CT imaging of the chest was performed using the standard protocol during bolus administration of intravenous contrast. Multiplanar CT image reconstructions and MIPs were obtained to evaluate the vascular anatomy. CONTRAST:  75mL OMNIPAQUE IOHEXOL 350 MG/ML SOLN COMPARISON:  Chest x-ray earlier today. FINDINGS: Patchy areas of ground-glass airspace opacity in both upper lobes, the right middle lobe and lingula as well as mild involvement at the lung bases in the lower lobes bilaterally is suggestive of multifocal pneumonia. No edema, nodule, pneumothorax or pleural effusion is identified. No evidence of airway obstruction. No pericardial fluid is seen. The heart size is normal. The pulmonary arteries are well opacified and there is no evidence of pulmonary embolism. The thoracic aorta is normal. Visualized upper abdominal structures show probable hepatic steatosis. Bony structures are unremarkable. Review of the MIP images confirms the above findings. IMPRESSION: Patchy ground-glass infiltrates bilaterally predominantly and upper and mid lung zones and consistent with multifocal  pneumonia. No associated edema, pleural fluid or pneumothorax. No pulmonary embolism. Electronically Signed   By: Irish Lack M.D.   On: 06/16/2015 12:53       --Wells Guiles, MD.   [facility]  Santiago Glad, M.D.  Stephanie Acre, M.D.  Billy Fischer, M.D

## 2015-06-17 NOTE — Care Management Note (Signed)
Case Management Note  Patient Details  Name: Gina Guzman MRN: [id number] Date of Birth: 02/20/1976  Subjective/Objective: RNCM assessment for discharge planning. [age] yof with history of asthma. Presents with acute respiratory failure on HFNC. Possible COPD exacerbation verses asthma. [facility].  Smoker. No home O2. She is insured.  It is anticipated that patient will have no acute needs at discharge.                  Action/Plan: Following progression.   Expected Discharge Date:  06/18/15               Expected Discharge Plan:  Home/Self Care  In-House Referral:     Discharge planning Services  CM Consult  Post Acute Care Choice:    Choice offered to:     DME Arranged:    DME Agency:     HH Arranged:    HH Agency:     Status of Service:  In process, will continue to follow  Medicare Important Message Given:    Date Medicare IM Given:    Medicare IM give by:    Date Additional Medicare IM Given:    Additional Medicare Important Message give by:     If discussed at Long Length of Stay Meetings, dates discussed:    Additional Comments:  Marily MemosLisa M Addelyn Alleman, RN 06/17/2015, 3:21 PM

## 2015-06-17 NOTE — Plan of Care (Signed)
Problem: Discharge Progression Outcomes Goal: Other Discharge Outcomes/Goals Outcome: Progressing Plan of care progress: -pt transfer from CCU -HFNC continues -IV abts continues -afebrile -monitor labs. WBC -tolerates diet -no complaints of pain, no distress or discomfort noted

## 2015-06-17 NOTE — Progress Notes (Signed)
ANTIBIOTIC CONSULT NOTE - INITIAL  Pharmacy Consult for Zosyn Dosing Indication: rule out pneumonia  No Known Allergies  Patient Measurements: Height: 5\' 8"  (172.7 cm) Weight: 208 lb 12.4 oz (94.7 kg) IBW/kg (Calculated) : 63.9   Vital Signs: Temp: 98 F (36.7 C) (11/02 0740) Temp Source: Oral (11/02 0740) BP: 116/74 mmHg (11/02 0800) Pulse Rate: 107 (11/02 0800) Intake/Output from previous day: 11/01 0701 - 11/02 0700 In: 1617.5 [P.O.:460; I.V.:1157.5] Out: 1100 [Urine:1100] Intake/Output from this shift:    Labs:  Recent Labs  06/16/15 1011 06/17/15 0429  WBC 10.9 13.4*  HGB 14.5 13.9  PLT 248 270  CREATININE 0.47 0.57   Estimated Creatinine Clearance: 111.3 mL/min (by C-G formula based on Cr of 0.57). No results for input(s): VANCOTROUGH, VANCOPEAK, VANCORANDOM, GENTTROUGH, GENTPEAK, GENTRANDOM, TOBRATROUGH, TOBRAPEAK, TOBRARND, AMIKACINPEAK, AMIKACINTROU, AMIKACIN in the last 72 hours.   Microbiology: Recent Results (from the past 720 hour(s))  MRSA PCR Screening     Status: None   Collection Time: 06/16/15  2:30 PM  Result Value Ref Range Status   MRSA by PCR NEGATIVE NEGATIVE Final    Comment:        The GeneXpert MRSA Assay (FDA approved for NASAL specimens only), is one component of a comprehensive MRSA colonization surveillance program. It is not intended to diagnose MRSA infection nor to guide or monitor treatment for MRSA infections.     Medical History: Past Medical History  Diagnosis Date  . Asthma     Medications:  Scheduled:  . azithromycin  500 mg Oral Daily  . enoxaparin (LOVENOX) injection  40 mg Subcutaneous Q24H  . guaiFENesin  600 mg Oral BID  . Influenza vac split quadrivalent PF  0.5 mL Intramuscular Tomorrow-1000  . levalbuterol  0.63 mg Nebulization Q6H  . methylPREDNISolone (SOLU-MEDROL) injection  40 mg Intravenous Q8H  . mometasone-formoterol  2 puff Inhalation BID  . piperacillin-tazobactam (ZOSYN)  IV  3.375 g  Intravenous 3 times per day  . tiotropium  18 mcg Inhalation Daily   Infusions:  . sodium chloride 75 mL/hr at 06/17/15 16100742   Assessment: Pharmacy consulted to dose Zosyn for 39 yo female admitted with acute respiratory failure due to acute asthma exacerbation. Patient previously received 1 day of ceftriaxone 1g and is on day 2 of azithromycin, currently 500mg  PO Q24hr.    Plan:  Will initiate patient on Zosyn EI 3.375g IV Q8hr. Will continue to follow clinically and narrow as appropriate.    Pharmacy will continue to monitor and adjust per consult.   Simpson,Michael L 06/17/2015,8:52 AM

## 2015-06-17 NOTE — Progress Notes (Addendum)
Inpatient Diabetes Program Recommendations  AACE/ADA: New Consensus Statement on Inpatient Glycemic Control (2015)  Target Ranges:  Prepandial:   less than 140 mg/dL      Peak postprandial:   less than 180 mg/dL (1-2 hours)      Critically ill patients:  140 - 180 mg/dL   Review of Glycemic Control:  Results for Patrica DuelGLASSCOCK, Hasina L (MRN [id number]) as of 06/17/2015 09:46  Ref. Range 06/16/2015 14:58  Glucose-Capillary Latest Ref Range: 65-99 mg/dL 409305 (H)    Diabetes history: History of Gestational diabetes Outpatient Diabetes medications: None Current orders for Inpatient glycemic control: None   Inpatient Diabetes Program Recommendations:   Please consider adding Novolog correction resistant tid with meals and HS.  Also please consider adding A1C to determine pre-hospitalization glycemic control.  Called and discussed with RN.   Thanks, Beryl MeagerJenny Marrisa Kimber, RN, BC-ADM Inpatient Diabetes Coordinator Pager [phone]

## 2015-06-18 LAB — HEMOGLOBIN A1C: HEMOGLOBIN A1C: 10.6 % — AB (ref 4.0–6.0)

## 2015-06-18 LAB — BASIC METABOLIC PANEL
Anion gap: 6 (ref 5–15)
BUN: 20 mg/dL (ref 6–20)
CO2: 25 mmol/L (ref 22–32)
CREATININE: 0.55 mg/dL (ref 0.44–1.00)
Calcium: 8.8 mg/dL — ABNORMAL LOW (ref 8.9–10.3)
Chloride: 101 mmol/L (ref 101–111)
Glucose, Bld: 355 mg/dL — ABNORMAL HIGH (ref 65–99)
POTASSIUM: 4.5 mmol/L (ref 3.5–5.1)
SODIUM: 132 mmol/L — AB (ref 135–145)

## 2015-06-18 LAB — CBC
HEMATOCRIT: 41.1 % (ref 35.0–47.0)
HEMOGLOBIN: 13.6 g/dL (ref 12.0–16.0)
MCH: 29.2 pg (ref 26.0–34.0)
MCHC: 33.1 g/dL (ref 32.0–36.0)
MCV: 88.1 fL (ref 80.0–100.0)
PLATELETS: 282 10*3/uL (ref 150–440)
RBC: 4.66 MIL/uL (ref 3.80–5.20)
RDW: 12.7 % (ref 11.5–14.5)
WBC: 14.6 10*3/uL — AB (ref 3.6–11.0)

## 2015-06-18 LAB — STREPTOCOCCUS PNEUMONIAE AG (CSF)
SOURCE OF SAMPLE SRC33: 183009
Streptococcus Pneumoniae Ag: NEGATIVE

## 2015-06-18 LAB — GLUCOSE, CAPILLARY
GLUCOSE-CAPILLARY: 297 mg/dL — AB (ref 65–99)
GLUCOSE-CAPILLARY: 403 mg/dL — AB (ref 65–99)
Glucose-Capillary: 337 mg/dL — ABNORMAL HIGH (ref 65–99)
Glucose-Capillary: 371 mg/dL — ABNORMAL HIGH (ref 65–99)

## 2015-06-18 MED ORDER — LIVING WELL WITH DIABETES BOOK
Freq: Once | Status: AC
  Administered 2015-06-18: 15:00:00
  Filled 2015-06-18: qty 1

## 2015-06-18 MED ORDER — INSULIN STARTER KIT- PEN NEEDLES (ENGLISH)
1.0000 | Freq: Once | Status: AC
  Administered 2015-06-18: 15:00:00 1
  Filled 2015-06-18: qty 1

## 2015-06-18 MED ORDER — INSULIN GLARGINE 100 UNIT/ML ~~LOC~~ SOLN
12.0000 [IU] | Freq: Every day | SUBCUTANEOUS | Status: DC
  Administered 2015-06-18: 09:00:00 12 [IU] via SUBCUTANEOUS
  Filled 2015-06-18 (×2): qty 0.12

## 2015-06-18 NOTE — Progress Notes (Signed)
[facility]ess [facility]    ASSESSMENT/PLAN    PULMONARY -Acute respiratory failure secondary to acute exacerbation of asthma. The patient appears to be doing better, can likely change to a by mouth prednisone taper from tomorrow. -Pneumonia. Given her recent exposure to antibiotics, the ceftriaxone was changed to Zosyn. -Likely element of allergies. Given the patient has a new dog, recommended that the PET be removed from the bedroom environment and the patient be started on an antihistamine. -We'll require an inhaled steroid at the time of discharge, flu vaccine, pneumococcal Pneumovax, and follow-up with pulmonary.  -The patient can likely be discharged tomorrow from a respiratory standpoint, she may need oxygen. Recommend a one-week course of oral antibiotics upon discharge, such as Augmentin and azithromycin, or Levaquin   INFECTIOUS -Multifocal pneumonia. -Likely element of acute bronchitis. P:  -We'll continue antibiotics. BCx2 11/1: Pending Mycoplasma IgM: 11/1 . Negative Streptococcal urinary antigen: 11/1 pending Influenza swab: 11/1. Negative Sputum pending    ENDOCRINE A: Hyperglycemia, likely due to steroids. The patient has a history of gestational diabetes. P:   Continue sliding scale insulin, the patient's steroids were decreased today.   ANTIMICROBIALS:  Azithromycin 11/1>> Ceftriaxone 11/1>> 11/2 Zosyn 11/1>>   ---------------------------------------   ----------------------------------------   Name: Gina Guzman MRN: [id number] DOB: Apr 03, 1976    ADMISSION DATE:  06/16/2015     SUBJECTIVE:   Patient feels her breathing is slightly improved today.  Review of Systems:  Constitutional: Feels well. Cardiovascular: No chest pain.  Pulmonary: Denies dyspnea.   The remainder of systems were reviewed and were found to be negative other than what is documented in the HPI.    VITAL SIGNS: Temp:  [97.6 F (36.4  C)-98.5 F (36.9 C)] 97.8 F (36.6 C) (11/03 0451) Pulse Rate:  [84-108] 84 (11/03 0451) Resp:  [18-29] 18 (11/03 0451) BP: (109-121)/(67-76) 116/67 mmHg (11/03 0451) SpO2:  [92 %-97 %] 93 % (11/03 0735) FiO2 (%):  [45 %] 45 % (11/03 0144) HEMODYNAMICS:   VENTILATOR SETTINGS: Vent Mode:  [-]  FiO2 (%):  [45 %] 45 % INTAKE / OUTPUT:  Intake/Output Summary (Last 24 hours) at 06/18/15 1246 Last data filed at 06/18/15 0817  Gross per 24 hour  Intake 843.75 ml  Output   2300 ml  Net -1456.25 ml    PHYSICAL EXAMINATION: Physical Examination:   VS: BP 116/67 mmHg  Pulse 84  Temp(Src) 97.8 F (36.6 C) (Oral)  Resp 18  Ht 5\' 8"  (1.727 m)  Wt 94.7 kg (208 lb 12.4 oz)  BMI 31.75 kg/m2  SpO2 93%  LMP  (LMP Unknown)  General Appearance: No distress  Neuro:without focal findings, mental status normal. HEENT: PERRLA, EOM intact. Pulmonary: Scattered bilateral wheezing. CardiovascularNormal S1,S2.  No m/r/g.   Abdomen: Benign, Soft, non-tender. Renal:  No costovertebral tenderness  GU:  Not performed at this time. Endocrine: No evident thyromegaly. Skin:   warm, no rashes, no ecchymosis  Extremities: normal, no cyanosis, clubbing.   LABS:   LABORATORY PANEL:   CBC  Recent Labs Lab 06/18/15 0415  WBC 14.6*  HGB 13.6  HCT 41.1  PLT 282    Chemistries   Recent Labs Lab 06/18/15 0415  NA 132*  K 4.5  CL 101  CO2 25  GLUCOSE 355*  BUN 20  CREATININE 0.55  CALCIUM 8.8*     Recent Labs Lab 06/17/15 1103 06/17/15 1655 06/17/15 2057 06/17/15 2156 06/18/15 0754 06/18/15 1147  GLUCAP 397* 298* 355* 369*  337* 371*    Recent Labs Lab 06/16/15 1315  PHART 7.43  PCO2ART 36  PO2ART 63*   No results for input(s): AST, ALT, ALKPHOS, BILITOT, ALBUMIN in the last 168 hours.  Cardiac Enzymes  Recent Labs Lab 06/16/15 1011  TROPONINI <0.03    RADIOLOGY:  No results found.     --Wells Guiles, MD.   [facility]  Santiago Glad, M.D.  Stephanie Acre, M.D.  Billy Fischer, M.D

## 2015-06-18 NOTE — Progress Notes (Signed)
Patient ID: Gina Guzman, female   DOB: [birth year], 39 y.o.   MRN: [id number] [facility] PROGRESS NOTE  PCP: No primary care provider on file.  HPI/Subjective: Patient feeling much better today. She was taken off the high flow nasal cannula this morning. Still on oxygen at this point. Still with cough and some shortness of breath.  Objective: Filed Vitals:   06/18/15 0451  BP: 116/67  Pulse: 84  Temp: 97.8 F (36.6 C)  Resp: 18    Filed Weights   06/16/15 0835 06/16/15 1430  Weight: 94.348 kg (208 lb) 94.7 kg (208 lb 12.4 oz)    ROS: Review of Systems  Constitutional: Negative for fever and chills.  Eyes: Negative for blurred vision.  Respiratory: Positive for cough, shortness of breath and wheezing.   Cardiovascular: Negative for chest pain.  Gastrointestinal: Negative for nausea, vomiting, abdominal pain, diarrhea and constipation.  Genitourinary: Negative for dysuria.  Musculoskeletal: Negative for joint pain.  Neurological: Negative for dizziness and headaches.   Exam: Physical Exam  Constitutional: She is oriented to person, place, and time.  HENT:  Nose: No mucosal edema.  Mouth/Throat: No oropharyngeal exudate or posterior oropharyngeal edema.  Eyes: Conjunctivae, EOM and lids are normal. Pupils are equal, round, and reactive to light.  Neck: No JVD present. Carotid bruit is not present. No edema present. No thyroid mass and no thyromegaly present.  Cardiovascular: S1 normal and S2 normal.  Tachycardia present.  Exam reveals no gallop.   No murmur heard. Pulses:      Dorsalis pedis pulses are 2+ on the right side, and 2+ on the left side.  Respiratory: No respiratory distress. She has decreased breath sounds in the right lower field and the left lower field. She has wheezes in the right lower field and the left upper field. She has no rhonchi. She has no rales.  GI: Soft. Bowel sounds are normal. There is no tenderness.  Musculoskeletal:   Right ankle: She exhibits no swelling.       Left ankle: She exhibits no swelling.  Lymphadenopathy:    She has no cervical adenopathy.  Neurological: She is alert and oriented to person, place, and time. No cranial nerve deficit.  Skin: Skin is warm. No rash noted. Nails show no clubbing.  Psychiatric: She has a normal mood and affect.    Data Reviewed: Basic Metabolic Panel:  Recent Labs Lab 06/16/15 1011 06/17/15 0429 06/18/15 0415  NA 138 134* 132*  K 3.6 4.1 4.5  CL 102 103 101  CO2 GLUCOSE 256* 337* 355*  BUN CREATININE 0.47 0.57 0.55  CALCIUM 9.1 9.0 8.8*   CBC:  Recent Labs Lab 06/16/15 1011 06/17/15 0429 06/18/15 0415  WBC 10.9 13.4* 14.6*  HGB 14.5 13.9 13.6  HCT 42.7 41.4 41.1  MCV 87.9 87.9 88.1  PLT 248 270 282    CBG:  Recent Labs Lab 06/17/15 1655 06/17/15 2057 06/17/15 2156 06/18/15 0754 06/18/15 1147  GLUCAP 298* 355* 369* 337* 371*    Recent Results (from the past 240 hour(s))  Blood culture (routine x 2)     Status: None (Preliminary result)   Collection Time: 06/16/15 12:18 PM  Result Value Ref Range Status   Specimen Description BLOOD RIGHT ASSIST CONTROL  Final   Special Requests   Final    BOTTLES DRAWN AEROBIC AND ANAEROBIC  2 CC AERO 1CC ANAERO   Culture NO GROWTH 2 DAYS  Final  Report Status PENDING  Incomplete  Blood culture (routine x 2)     Status: None (Preliminary result)   Collection Time: 06/16/15 12:18 PM  Result Value Ref Range Status   Specimen Description BLOOD LEFT HAND  Final   Special Requests BOTTLES DRAWN AEROBIC AND ANAEROBIC  1CC  Final   Culture NO GROWTH 2 DAYS  Final   Report Status PENDING  Incomplete  MRSA PCR Screening     Status: None   Collection Time: 06/16/15  2:30 PM  Result Value Ref Range Status   MRSA by PCR NEGATIVE NEGATIVE Final    Comment:        The GeneXpert MRSA Assay (FDA approved for NASAL specimens only), is one component of a comprehensive MRSA  colonization surveillance program. It is not intended to diagnose MRSA infection nor to guide or monitor treatment for MRSA infections.   Streptococcus Pneumoniae Ag     Status: None   Collection Time: 06/16/15  5:34 PM  Result Value Ref Range Status   Specimen Source Urine  Final   Streptococcus Pneumoniae Ag Negative Negative Final   Body Fld Culture, Sterile Not Indicated  Final   Org ID Not indicated.  Final   Please Note: Comment  Final    Comment: (NOTE) College of American Pathologists guidelines require a culture to be performed on CSF specimens negative by bacterial antigen testing (CAP MIC.22550). Performed At: [facility] [address] [place], [place] [id number] Mila HomerHancock William F MD KG:[phone]    Source of Sample [id number]  Final     Scheduled Meds: . antiseptic oral rinse  7 mL Mouth Rinse BID  . azithromycin  500 mg Oral Daily  . enoxaparin (LOVENOX) injection  40 mg Subcutaneous Q24H  . guaiFENesin  600 mg Oral BID  . insulin aspart  0-20 Units Subcutaneous TID WC  . insulin aspart  0-5 Units Subcutaneous QHS  . insulin glargine  12 Units Subcutaneous Daily  . levalbuterol  0.63 mg Nebulization Q6H  . methylPREDNISolone (SOLU-MEDROL) injection  40 mg Intravenous Q8H  . mometasone-formoterol  2 puff Inhalation BID  . piperacillin-tazobactam (ZOSYN)  IV  3.375 g Intravenous 3 times per day    Assessment/Plan:  1. Acute respiratory failure with hypoxia. Patient taken off of high flow nasal cannula this morning and is on regular nasal cannula. I will check a pulse ox tomorrow morning. 2. Multifocal pneumonia bilaterally. Not seen on chest x-ray. Seen on CAT scan as groundglass opacities. Patient put on aggressive antibiotics with Zithromax and Zosyn. 3. COPD exacerbation versus asthmatic bronchitis with pneumonia- the patient is moving much better air today continue Solu-Medrol and nebulizer treatments. 4. Tobacco abuse- smoking cessation counseling  done 3 minutes by me. 5. Type 2 diabetes mellitus with history of gestational diabetes. Hemoglobin A1c elevated 11.2 patient was started on low-dose Lantus and sliding scale. Diet exercise and weight loss discussed at length. Dietary consultation for diet. Nursing to teach about insulin injections.  Code Status:     Code Status Orders        Start     Ordered   06/16/15 1436  Full code   Continuous     06/16/15 1435     Disposition Plan: Potential home tomorrow or Saturday depending on when we can get her off oxygen.  Antibiotics:  Zosyn  Zithromax  Time spent: 20 minutes  Alford HighlandWIETING, Armenta Erskin  [facility]

## 2015-06-18 NOTE — Progress Notes (Addendum)
Inpatient Diabetes Program Recommendations  AACE/ADA: New Consensus Statement on Inpatient Glycemic Control (2015)  Target Ranges:  Prepandial:   less than 140 mg/dL      Peak postprandial:   less than 180 mg/dL (1-2 hours)      Critically ill patients:  140 - 180 mg/dL   Review of Glycemic Control:  Results for Gina Guzman, Gina Guzman (MRN [id number]) as of 06/18/2015 15:55  Ref. Range 06/17/2015 16:55 06/17/2015 20:57 06/17/2015 21:56 06/18/2015 07:54 06/18/2015 11:47  Glucose-Capillary Latest Ref Range: 65-99 mg/dL 298 (H) 355 (H) 369 (H) 337 (H) 371 (H)  Results for Gina Guzman, Gina Guzman (MRN [id number]) as of 06/18/2015 15:55  Ref. Range 06/17/2015 04:29  Hemoglobin A1C Latest Ref Range: 4.0-6.0 % 11.2 (H)    Diabetes history: Gestational diabetes-New onset Type 2 diabetes Outpatient Diabetes medications: None-patient was on insulin during her pregnancies Current orders for Inpatient glycemic control:  Novolog resistant tid with meals and HS, Lantus 12 units daily  Inpatient Diabetes Program Recommendations:   Discussed A1C results with patient and new onset diabetes.  She states that she was on insulin with her pregnancies 9-10 years ago.  She does not have a PCP.  She does endorse being sick in September and being on steroids for bronchitis.  Explained that steroids can increase blood sugars.  Note that basal insulin has been started.  Discussed the action of Lantus insulin with patient.   Also taught patient to use insulin pen.  Explained how to put needle on, 2 unit prime, injection technique and holding in place for 6-10 seconds.  Patient returned demonstration and is interested in having insulin pen at discharge if insulin is ordered.  Also discussed monitoring of blood sugars including checking prior to meals.  Discussed glucose goals and the difference between gestational goals and blood sugar goals when not pregnant.     If appropriate, may also consider adding Metformin to Lantus.  Ordered Living  well with diabetes booklet and insulin starter kit.  Will also place order for Outpatient diabetes follow-up per protocol/cosign required. Patient will need PCP for management of medical needs and is also interested in diabetes specialist.  She states she has discussed with hospitalist. Will follow.  MD at discharge patient will need order for glucose meter, insulin pen and insulin pen needles (if appropriate).    Thanks, Adah Perl, RN, BC-ADM Inpatient Diabetes Coordinator Pager [phone] (8a-5p)

## 2015-06-19 LAB — GLUCOSE, CAPILLARY
GLUCOSE-CAPILLARY: 307 mg/dL — AB (ref 65–99)
GLUCOSE-CAPILLARY: 392 mg/dL — AB (ref 65–99)

## 2015-06-19 MED ORDER — AMOXICILLIN-POT CLAVULANATE 875-125 MG PO TABS: 1.0000 | ORAL_TABLET | Freq: Two times a day (BID) | ORAL | Status: DC

## 2015-06-19 MED ORDER — INSULIN ASPART 100 UNIT/ML ~~LOC~~ SOLN: 8.0000 [IU] | Freq: Three times a day (TID) | SUBCUTANEOUS | Status: AC

## 2015-06-19 MED ORDER — INSULIN GLARGINE 100 UNIT/ML ~~LOC~~ SOLN
20.0000 [IU] | Freq: Every day | SUBCUTANEOUS | Status: DC
  Administered 2015-06-19: 20 [IU] via SUBCUTANEOUS
  Filled 2015-06-19 (×2): qty 0.2

## 2015-06-19 MED ORDER — NICOTINE 10 MG IN INHA: 1.0000 | RESPIRATORY_TRACT | Status: DC | PRN

## 2015-06-19 MED ORDER — MOMETASONE FURO-FORMOTEROL FUM 100-5 MCG/ACT IN AERO
2.0000 | INHALATION_SPRAY | Freq: Two times a day (BID) | RESPIRATORY_TRACT | Status: AC
Start: 2015-06-19 — End: ?

## 2015-06-19 MED ORDER — PREDNISONE 5 MG PO TABS: ORAL_TABLET | ORAL | Status: DC

## 2015-06-19 MED ORDER — ALBUTEROL SULFATE HFA 108 (90 BASE) MCG/ACT IN AERS: 2.0000 | INHALATION_SPRAY | Freq: Four times a day (QID) | RESPIRATORY_TRACT | Status: DC | PRN

## 2015-06-19 MED ORDER — LORATADINE 10 MG PO TABS: 10.0000 mg | ORAL_TABLET | Freq: Every day | ORAL | Status: DC

## 2015-06-19 MED ORDER — INSULIN GLARGINE 100 UNIT/ML ~~LOC~~ SOLN: 20.0000 [IU] | Freq: Every day | SUBCUTANEOUS | Status: AC

## 2015-06-19 NOTE — Care Management (Signed)
Spoke with Ms. Gina Guzman at the bedside regarding durable medical equipment agencies. If Ms. Gina Guzman needs home oxygen, she would like to go thru [facility]. Dr. Renae GlossWieting will access the need for home oxygen this afternoon. Possible home today or tomorrow. Gwenette GreetBrenda S Rubie Ficco RN MSN CCM Care Management [phone]

## 2015-06-19 NOTE — Discharge Summary (Signed)
[facility] - [place] at [facility]   PATIENT NAME: Gina Guzman    MR#:  [id number]  DATE OF BIRTH:  Sep 26, 1975  DATE OF ADMISSION:  06/16/2015 ADMITTING PHYSICIAN: Auburn Bilberry, MD  DATE OF DISCHARGE: 06/19/2015  PRIMARY CARE PHYSICIAN: No primary care provider on file.    ADMISSION DIAGNOSIS:  SOB (shortness of breath) [R06.02] COPD exacerbation (HCC) [J44.1]  DISCHARGE DIAGNOSIS:  Active Problems:   Acute exacerbation of chronic obstructive pulmonary disease (COPD) (HCC)   Acute respiratory failure (HCC)   SECONDARY DIAGNOSIS:   Past Medical History  Diagnosis Date  . Asthma     HOSPITAL COURSE:   1. Acute respiratory failure with hypoxia patient was initially on BiPAP minutes which the high flow nasal cannula. Up until the day of discharge she was on oxygen. Even in the morning of discharge she dropped down in the 80s with ambulation. In the afternoon of discharge she was had a room air saturation of 93% and with ambulation dropped down to 90%. 2. Multifocal pneumonia- patient was started on Zosyn and Zithromax. She was switched over to Augmentin upon discharge. 3 COPD exacerbation- high dose Solu-Medrol started. Patient was moving better air upon discharge. Just slight expiratory wheeze upon discharge. Patient was prescribed Dulera an albuterol inhaler. 4. Type 2 diabetes. Patient has a history of gestational diabetes. Hemoglobin A1c above 11. With steroids her sugars have been high while here. The patient was started on Lantus 20 mg subcutaneous injection daily. Also started on Apidra sliding scale. 5. Tobacco abuse smoking cessation counseling done during the hospital course. Patient must stop smoking.  DISCHARGE CONDITIONS:   Satisfactory  CONSULTS OBTAINED:  Treatment Team:  Auburn Bilberry, MD  DRUG ALLERGIES:  No Known Allergies  DISCHARGE MEDICATIONS:   Current Discharge Medication List    START taking these medications   Details  amoxicillin-clavulanate (AUGMENTIN) 875-125 MG tablet Take 1 tablet by mouth every 12 (twelve) hours. Qty: 13 tablet, Refills: 0    insulin aspart (NOVOLOG) 100 UNIT/ML injection Inject 8 Units into the skin 3 (three) times daily with meals. Qty: 10 mL, Refills: 1    insulin glargine (LANTUS) 100 UNIT/ML injection Inject 0.2 mLs (20 Units total) into the skin daily. Qty: 10 mL, Refills: 1    mometasone-formoterol (DULERA) 100-5 MCG/ACT AERO Inhale 2 puffs into the lungs 2 (two) times daily. Qty: 1 Inhaler, Refills: 1    nicotine (NICOTROL) 10 MG inhaler Inhale 1 cartridge (1 continuous puffing total) into the lungs as needed for smoking cessation. Qty: 42 each, Refills: 0    predniSONE (DELTASONE) 5 MG tablet 6 tabs day1; 5 tabs day2; 4 tabs day3; 3 tabs day4; 2 tabs day5; 1 tab day6,7 Qty: 22 tablet, Refills: 0      CONTINUE these medications which have CHANGED   Details  albuterol (PROAIR HFA) 108 (90 BASE) MCG/ACT inhaler Inhale 2 puffs into the lungs every 6 (six) hours as needed for wheezing. Qty: 1 Inhaler, Refills: 2      STOP taking these medications     Pseudoephedrine-Naproxen Na 120-220 MG TB12          DISCHARGE INSTRUCTIONS:   Follow-up with Dr. Tedd Sias one week. Follow-up with Dr. Marcello Fennel in 3 weeks  If you experience worsening of your admission symptoms, develop shortness of breath, life threatening emergency, suicidal or homicidal thoughts you must seek medical attention immediately by calling 911 or calling your MD immediately  if symptoms less severe.  You Must  read complete instructions/literature along with all the possible adverse reactions/side effects for all the Medicines you take and that have been prescribed to you. Take any new Medicines after you have completely understood and accept all the possible adverse reactions/side effects.   Please note  You were cared for by a hospitalist during your hospital stay. If you have any questions about  your discharge medications or the care you received while you were in the hospital after you are discharged, you can call the unit and asked to speak with the hospitalist on call if the hospitalist that took care of you is not available. Once you are discharged, your primary care physician will handle any further medical issues. Please note that NO REFILLS for any discharge medications will be authorized once you are discharged, as it is imperative that you return to your primary care physician (or establish a relationship with a primary care physician if you do not have one) for your aftercare needs so that they can reassess your need for medications and monitor your lab values.    Today   CHIEF COMPLAINT:   Chief Complaint  Patient presents with  . Shortness of Breath    HISTORY OF PRESENT ILLNESS:  Gina Guzman  is a 39 y.o. female with a known history of asthma in the past presents with shortness of breath and found to be in acute respiratory failure.   VITAL SIGNS:  Blood pressure 110/73, pulse 74, temperature 98 F (36.7 C), temperature source Oral, resp. rate 18, height  (1.727 m), weight 94.7 kg (208 lb 12.4 oz), SpO2 92 %.    PHYSICAL EXAMINATION:  GENERAL:  39 y.o.-year-old patient lying in the bed with no acute distress.  EYES: Pupils equal, round, reactive to light and accommodation. No scleral icterus. Extraocular muscles intact.  HEENT: Head atraumatic, normocephalic. Oropharynx and nasopharynx clear.  NECK:  Supple, no jugular venous distention. No thyroid enlargement, no tenderness.  LUNGS: Decreased breath sounds bilaterally, slight expiratory wheeze right lung, no rales,rhonchi or crepitation. No use of accessory muscles of respiration.  CARDIOVASCULAR: S1, S2 normal. No murmurs, rubs, or gallops.  ABDOMEN: Soft, non-tender, non-distended. Bowel sounds present. No organomegaly or mass.  EXTREMITIES: No pedal edema, cyanosis, or clubbing.  NEUROLOGIC: Cranial  nerves II through XII are intact. Muscle strength 5/5 in all extremities. Sensation intact. Gait not checked.  PSYCHIATRIC: The patient is alert and oriented x 3.  SKIN: No obvious rash, lesion, or ulcer.   DATA REVIEW:   CBC  Recent Labs Lab 06/18/15 0415  WBC 14.6*  HGB 13.6  HCT 41.1  PLT 282    Chemistries   Recent Labs Lab 06/18/15 0415  NA 132*  K 4.5  CL 101  CO2 25  GLUCOSE 355*  BUN 20  CREATININE 0.55  CALCIUM 8.8*    Cardiac Enzymes  Recent Labs Lab 06/16/15 1011  TROPONINI <0.03    Microbiology Results  Results for orders placed or performed during the hospital encounter of 06/16/15  Blood culture (routine x 2)     Status: None (Preliminary result)   Collection Time: 06/16/15 12:18 PM  Result Value Ref Range Status   Specimen Description BLOOD RIGHT ASSIST CONTROL  Final   Special Requests   Final    BOTTLES DRAWN AEROBIC AND ANAEROBIC  2 CC AERO 1CC ANAERO   Culture NO GROWTH 3 DAYS  Final   Report Status PENDING  Incomplete  Blood culture (routine x 2)  Status: None (Preliminary result)   Collection Time: 06/16/15 12:18 PM  Result Value Ref Range Status   Specimen Description BLOOD LEFT HAND  Final   Special Requests BOTTLES DRAWN AEROBIC AND ANAEROBIC  1CC  Final   Culture NO GROWTH 3 DAYS  Final   Report Status PENDING  Incomplete  MRSA PCR Screening     Status: None   Collection Time: 06/16/15  2:30 PM  Result Value Ref Range Status   MRSA by PCR NEGATIVE NEGATIVE Final    Comment:        The GeneXpert MRSA Assay (FDA approved for NASAL specimens only), is one component of a comprehensive MRSA colonization surveillance program. It is not intended to diagnose MRSA infection nor to guide or monitor treatment for MRSA infections.   Streptococcus Pneumoniae Ag     Status: None   Collection Time: 06/16/15  5:34 PM  Result Value Ref Range Status   Specimen Source Urine  Final   Streptococcus Pneumoniae Ag Negative Negative  Final   Body Fld Culture, Sterile Not Indicated  Final   Org ID Not indicated.  Final   Please Note: Comment  Final    Comment: (NOTE) College of American Pathologists guidelines require a culture to be performed on CSF specimens negative by bacterial antigen testing (CAP MIC.22550). Performed At: [facility] [address] [place], [place] [id number] Mila HomerHancock William F MD WU:[phone]    Source of Sample [id number]  Final     Management plans discussed with the patient, family and they are in agreement.  CODE STATUS:     Code Status Orders        Start     Ordered   06/16/15 1436  Full code   Continuous     06/16/15 1435      TOTAL TIME TAKING CARE OF THIS PATIENT: 35 minutes.    Alford HighlandWIETING, Amahd Morino M.D on 06/19/2015 at 2:51 PM  Between 7am to 6pm - Pager - [phone]  After 6pm go to [email or web address] - password [email or web address] [facility]  [facility]  Office  [phone]  CC: Primary care physician; No primary care provider on file.

## 2015-06-19 NOTE — Progress Notes (Signed)
[facility] Medicine Progess Note    ASSESSMENT/PLAN    PULMONARY -Acute respiratory failure secondary to acute exacerbation of asthma. Okay to change steroids to a by mouth prednisone taper. Would likely taper over a 6-7 day period. -Pneumonia. Stable for discharge home from respiratory standpoint with a seven-day course of antibiotics. -Likely element of allergies. Given the patient has a new dog, recommended that the PET be removed from the bedroom environment and the patient take a daily antihistamine such as Claritin 10 mg once daily. -Will require an inhaled steroid at the time of discharge, flu vaccine, pneumococcal Pneumovax, and follow-up with pulmonary.  -The patient can likely be discharged tomorrow from a respiratory standpoint, she may need oxygen. Recommend a one-week course of oral antibiotics upon discharge, such as Augmentin and azithromycin, or Levaquin  Will sign off for now. Patient is asked to follow up with me in the office in 3-4 weeks, please call. There are any further questions or concerns.  INFECTIOUS -Multifocal pneumonia. -Likely element of acute bronchitis. P:   BCx2 11/1: Negative Mycoplasma IgM: 11/1 . Negative Streptococcal urinary antigen: 11/1 . Negative Influenza swab: 11/1. Negative Sputum pending     ENDOCRINE A: Hyperglycemia, likely due to steroids. The patient has a history of gestational diabetes. P:   Continue sliding scale insulin, the patient's steroids were decreased today.   ANTIMICROBIALS:  Azithromycin 11/1>> Ceftriaxone 11/1>> 11/2 Zosyn 11/1>>   ---------------------------------------   ----------------------------------------   Name: Gina Guzman MRN: [id number] DOB: Jul 01, 1976    ADMISSION DATE:  06/16/2015     SUBJECTIVE:   Patient feels her breathing is slightly improved today.  Review of Systems:  Constitutional: Feels well. Cardiovascular: No chest pain.  Pulmonary: Denies  dyspnea.   The remainder of systems were reviewed and were found to be negative other than what is documented in the HPI.    VITAL SIGNS: Temp:  [98 F (36.7 C)-98.3 F (36.8 C)] 98 F (36.7 C) (11/04 0530) Pulse Rate:  [74-78] 74 (11/04 0530) Resp:  [18-20] 18 (11/04 0530) BP: (109-110)/(71-73) 110/73 mmHg (11/04 0530) SpO2:  [92 %-95 %] 92 % (11/04 0842) HEMODYNAMICS:   VENTILATOR SETTINGS:   INTAKE / OUTPUT:  Intake/Output Summary (Last 24 hours) at 06/19/15 1319 Last data filed at 06/19/15 0800  Gross per 24 hour  Intake    480 ml  Output   1800 ml  Net  -1320 ml    PHYSICAL EXAMINATION: Physical Examination:   VS: BP 110/73 mmHg  Pulse 74  Temp(Src) 98 F (36.7 C) (Oral)  Resp 18  Ht  (1.727 m)  Wt 94.7 kg (208 lb 12.4 oz)  BMI 31.75 kg/m2  SpO2 92%  LMP  (LMP Unknown)  General Appearance: No distress  Neuro:without focal findings, mental status normal. HEENT: PERRLA, EOM intact. Pulmonary: Scattered bilateral wheezing. CardiovascularNormal S1,S2.  No m/r/g.   Abdomen: Benign, Soft, non-tender. Renal:  No costovertebral tenderness  GU:  Not performed at this time. Endocrine: No evident thyromegaly. Skin:   warm, no rashes, no ecchymosis  Extremities: normal, no cyanosis, clubbing.   LABS:   LABORATORY PANEL:   CBC  Recent Labs Lab 06/18/15 0415  WBC 14.6*  HGB 13.6  HCT 41.1  PLT 282    Chemistries   Recent Labs Lab 06/18/15 0415  NA 132*  K 4.5  CL 101  CO2 25  GLUCOSE 355*  BUN 20  CREATININE 0.55  CALCIUM 8.8*     Recent  Labs Lab 06/18/15 0754 06/18/15 1147 06/18/15 1720 06/18/15 2141 06/19/15 0814 06/19/15 1118  GLUCAP 337* 371* 297* 403* 307* 392*    Recent Labs Lab 06/16/15 1315  PHART 7.43  PCO2ART 36  PO2ART 63*   No results for input(s): AST, ALT, ALKPHOS, BILITOT, ALBUMIN in the last 168 hours.  Cardiac Enzymes  Recent Labs Lab 06/16/15 1011  TROPONINI <0.03    RADIOLOGY:  No results  found.     --Wells Guileseep Princessa Lesmeister, MD.   [facility]  Santiago Gladavid Kasa, M.D.  Stephanie AcreVishal Mungal, M.D.  Billy Fischeravid Simonds, M.D

## 2015-06-19 NOTE — Discharge Instructions (Signed)
Respiratory failure is when your lungs are not working well and your breathing (respiratory) system fails. When respiratory failure occurs, it is difficult for your lungs to get enough oxygen, get rid of carbon dioxide, or both. Respiratory failure can be life threatening.  Respiratory failure can be acute or chronic. Acute respiratory failure is sudden, severe, and requires emergency medical treatment. Chronic respiratory failure is less severe, happens over time, and requires ongoing treatment.  WHAT ARE THE CAUSES OF ACUTE RESPIRATORY FAILURE?  Any problem affecting the heart or lungs can cause acute respiratory failure. Some of these causes include the following:  Chronic bronchitis and emphysema (COPD).   Blood clot going to a lung (pulmonary embolism).   Having water in the lungs caused by heart failure, lung injury, or infection (pulmonary edema).   Collapsed lung (pneumothorax).   Pneumonia.   Pulmonary fibrosis.   Obesity.   Asthma.   Heart failure.   Any type of trauma to the chest that can make breathing difficult.   Nerve or muscle diseases making chest movements difficult. HOW WILL MY ACUTE RESPIRATORY FAILURE BE TREATED?  Treatment of acute respiratory failure depends on the cause of the respiratory failure. Usually, you will stay in the intensive care unit so your breathing can be watched closely. Treatment can include the following:  Oxygen. Oxygen can be delivered through the following:  Nasal cannula. This is small tubing that goes in your nose to give you oxygen.  Face mask. A face mask covers your nose and mouth to give you oxygen.  Medicine. Different medicines can be given to help with breathing. These can include:  Nebulizers. Nebulizers deliver medicines to open the air passages (bronchodilators). These medicines help to open or relax the airways in the lungs so you can breathe better. They can also help loosen mucus from your  lungs.  Diuretics. Diuretic medicines can help you breathe better by getting rid of extra water in your body.  Steroids. Steroid medicines can help decrease swelling (inflammation) in your lungs.  Antibiotics.  Chest tube. If you have a collapsed lung (pneumothorax), a chest tube is placed to help reinflate the lung.  Noninvasive positive pressure ventilation (NPPV). This is a tight-fitting mask that goes over your nose and mouth. The mask has tubing that is attached to a machine. The machine blows air into the tubing, which helps to keep the tiny air sacs (alveoli) in your lungs open. This machine allows you to breathe on your own.  Ventilator. A ventilator is a breathing machine. When on a ventilator, a breathing tube is put into the lungs. A ventilator is used when you can no longer breathe well enough on your own. You may have low oxygen levels or high carbon dioxide (CO2) levels in your blood. When you are on a ventilator, sedation and pain medicines are given to make you sleep so your lungs can heal. SEEK IMMEDIATE MEDICAL CARE IF:  You have shortness of breath (dyspnea) with or without activity.  You have rapid breathing (tachypnea).  You are wheezing.  You are unable to say more than a few words without having to catch your breath.  You find it very difficult to function normally.  You have a fast heart rate.  You have a bluish color to your finger or toe nail beds.  You have confusion or drowsiness or both.   This information is not intended to replace advice given to you by your health care provider. Make sure you discuss  any questions you have with your health care provider.   Document Released: 08/06/2013 Document Revised: 04/22/2015 Document Reviewed: 08/06/2013 Elsevier Interactive Patient Education 2016 Elsevier Inc.  Chronic Obstructive Pulmonary Disease Exacerbation Chronic obstructive pulmonary disease (COPD) is a common lung problem. In COPD, the flow of air  from the lungs is limited. COPD exacerbations are times that breathing gets worse and you need extra treatment. Without treatment they can be life threatening. If they happen often, your lungs can become more damaged. If your COPD gets worse, your doctor may treat you with:  Medicines.  Oxygen.  Different ways to clear your airway, such as using a mask. HOME CARE  Do not smoke.  Avoid tobacco smoke and other things that bother your lungs.  If given, take your antibiotic medicine as told. Finish the medicine even if you start to feel better.  Only take medicines as told by your doctor.  Drink enough fluids to keep your pee (urine) clear or pale yellow (unless your doctor has told you not to).  Use a cool mist machine (vaporizer).  If you use oxygen or a machine that turns liquid medicine into a mist (nebulizer), continue to use them as told.  Keep up with shots (vaccinations) as told by your doctor.  Exercise regularly.  Eat healthy foods.  Keep all doctor visits as told. GET HELP RIGHT AWAY IF:  You are very short of breath and it gets worse.  You have trouble talking.  You have bad chest pain.  You have blood in your spit (sputum).  You have a fever.  You keep throwing up (vomiting).  You feel weak, or you pass out (faint).  You feel confused.  You keep getting worse. MAKE SURE YOU:  Understand these instructions.  Will watch your condition.  Will get help right away if you are not doing well or get worse.   This information is not intended to replace advice given to you by your health care provider. Make sure you discuss any questions you have with your health care provider.   Document Released: 07/21/2011 Document Revised: 08/22/2014 Document Reviewed: 04/05/2013 Elsevier Interactive Patient Education [employer] Elsevier Inc.

## 2015-06-19 NOTE — Progress Notes (Signed)
Inpatient Diabetes Program Recommendations  AACE/ADA: New Consensus Statement on Inpatient Glycemic Control (2015)  Target Ranges:  Prepandial:   less than 140 mg/dL      Peak postprandial:   less than 180 mg/dL (1-2 hours)      Critically ill patients:  140 - 180 mg/dL   Review of Glycemic Control  Results for Gina Guzman, [name] (MRN [id number]) as of 06/19/2015 11:49  Ref. Range 06/18/2015 11:47 06/18/2015 17:20 06/18/2015 21:41 06/19/2015 08:14 06/19/2015 11:18  Glucose-Capillary Latest Ref Range: 65-99 mg/dL 409371 (H) 811297 (H) 914403 (H) 307 (H) 392 (H)   Diabetes history: Gestational diabetes-New onset Type 2 diabetes Outpatient Diabetes medications: None-patient was on insulin during her pregnancies Current orders for Inpatient glycemic control:  Novolog resistant tid with meals and HS, Lantus 20 units daily  If appropriate, may also consider adding Metformin to Lantus. Please consider ordering an outpatient diabetes [facility] referral.  Patient will need PCP for management of medical needs and is also interested in diabetes specialist. She states she has discussed with hospitalist. Will follow.  MD at discharge patient will need order for glucose meter, insulin pen and insulin pen needles (if appropriate).   Susette RacerJulie Rosemond Lyttle, RN, BA, MHA, CDE Diabetes Coordinator Inpatient Diabetes Program  [phone] (Team Pager) [phone] [facility] Office) 06/19/2015 11:53 AM

## 2015-06-19 NOTE — Plan of Care (Signed)
Problem: Activity: Goal: Ability to tolerate increased activity will improve Outcome: Progressing [facility] shift without SOB or dyspnea.  Patient able to perform active ROM without SOB this shift.    Problem: Education: Goal: Knowledge of disease or condition will improve Outcome: Progressing Education on energy conservation techniques and recognizing low oxygen levels given.  Patient on 4L O2 this shift.   Goal: Knowledge of the prescribed therapeutic regimen will improve Outcome: Progressing Education on new diabetes management.  Education on the use of steroids increasing blood glucose levels.  Education on diet and activity as well blood glucose management effect on A1C.  Education provided on what A1C measures.    Problem: Respiratory: Goal: Ability to maintain a clear airway will improve Outcome: Progressing Patient able to maintain clear airway this shift.  Deep breathing techniques taught with patient demonstration.  Encouragement of fluid intake.  Assessment of cough and sputum.   Goal: Levels of oxygenation will improve Outcome: Progressing Patient maintaining O2 levels above 92% this shift.  [facility] and continued to maintain above 92% without SOB or dyspnea on exertion.

## 2015-06-19 NOTE — Progress Notes (Signed)
MD making rounds. Discharge orders received. Appointments Scheduled.IV removed. Prescriptions given to patient. Discharge paperwork provided, explained, signed and witnessed. Education handouts provided to patient. No unanswered questions. Escorted via wheelchair by Scientist, research (physical sciences)auxiliary staff. All belongings sent with patient and family.

## 2015-06-19 NOTE — Progress Notes (Signed)
ANTIBIOTIC CONSULT NOTE - FOLLOW UP  Pharmacy Consult for piperacillin/tazobactam Indication: pneumonia  No Known Allergies  Patient Measurements: Height: 5\' 8"  (172.7 cm) Weight: 208 lb 12.4 oz (94.7 kg) IBW/kg (Calculated) : 63.9  Vital Signs: Temp: 98 F (36.7 C) (11/04 0530) Temp Source: Oral (11/04 0530) BP: 110/73 mmHg (11/04 0530) Pulse Rate: 74 (11/04 0530) Intake/Output from previous day: 11/03 0701 - 11/04 0700 In: 480 [P.O.:480] Out: 1800 [Urine:1800] Intake/Output from this shift: Total I/O In: 240 [P.O.:240] Out: -   Labs:  Recent Labs  06/17/15 0429 06/18/15 0415  WBC 13.4* 14.6*  HGB 13.9 13.6  PLT 270 282  CREATININE 0.57 0.55   Estimated Creatinine Clearance: 111.3 mL/min (by C-G formula based on Cr of 0.55). No results for input(s): VANCOTROUGH, VANCOPEAK, VANCORANDOM, GENTTROUGH, GENTPEAK, GENTRANDOM, TOBRATROUGH, TOBRAPEAK, TOBRARND, AMIKACINPEAK, AMIKACINTROU, AMIKACIN in the last 72 hours.   Microbiology: Recent Results (from the past 720 hour(s))  Blood culture (routine x 2)     Status: None (Preliminary result)   Collection Time: 06/16/15 12:18 PM  Result Value Ref Range Status   Specimen Description BLOOD RIGHT ASSIST CONTROL  Final   Special Requests   Final    BOTTLES DRAWN AEROBIC AND ANAEROBIC  2 CC AERO 1CC ANAERO   Culture NO GROWTH 3 DAYS  Final   Report Status PENDING  Incomplete  Blood culture (routine x 2)     Status: None (Preliminary result)   Collection Time: 06/16/15 12:18 PM  Result Value Ref Range Status   Specimen Description BLOOD LEFT HAND  Final   Special Requests BOTTLES DRAWN AEROBIC AND ANAEROBIC  1CC  Final   Culture NO GROWTH 3 DAYS  Final   Report Status PENDING  Incomplete  MRSA PCR Screening     Status: None   Collection Time: 06/16/15  2:30 PM  Result Value Ref Range Status   MRSA by PCR NEGATIVE NEGATIVE Final    Comment:        The GeneXpert MRSA Assay (FDA approved for NASAL specimens only), is  one component of a comprehensive MRSA colonization surveillance program. It is not intended to diagnose MRSA infection nor to guide or monitor treatment for MRSA infections.   Streptococcus Pneumoniae Ag     Status: None   Collection Time: 06/16/15  5:34 PM  Result Value Ref Range Status   Specimen Source Urine  Final   Streptococcus Pneumoniae Ag Negative Negative Final   Body Fld Culture, Sterile Not Indicated  Final   Org ID Not indicated.  Final   Please Note: Comment  Final    Comment: (NOTE) College of American Pathologists guidelines require a culture to be performed on CSF specimens negative by bacterial antigen testing (CAP MIC.22550). Performed At: [facility] [address] [place], [place] [id number] Mila HomerHancock William F MD KG:[phone]    Source of Sample [id number]  Final    Anti-infectives    Start     Dose/Rate Route Frequency Ordered Stop   06/17/15 0900  piperacillin-tazobactam (ZOSYN) IVPB 3.375 g     3.375 g 12.5 mL/hr over 240 Minutes Intravenous 3 times per day 06/17/15 0848     06/16/15 1145  cefTRIAXone (ROCEPHIN) 1 g in dextrose 5 % 50 mL IVPB  Status:  Discontinued     1 g 100 mL/hr over 30 Minutes Intravenous Every 24 hours 06/16/15 1144 06/17/15 0811   06/16/15 1145  azithromycin (ZITHROMAX) tablet 500 mg     500 mg Oral Daily  06/16/15 1144     06/16/15 1130  cefTRIAXone (ROCEPHIN) 1 g in dextrose 5 % 50 mL IVPB     1 g 100 mL/hr over 30 Minutes Intravenous  Once 06/16/15 1124 06/16/15 1313   06/16/15 1130  azithromycin (ZITHROMAX) tablet 500 mg     500 mg Oral  Once 06/16/15 1124 06/16/15 1244      Assessment: Pharmacy is dosing piperacillin/tazobactam in this 39 year old female being treated for pneumonia. She is currently on day #4 azithromycin 500 mg PO daily and day #3 piperacillin/tazobactam.   Plan:  Continue current dose of piperacillin/tazobactam 3.375 g IV q8h EI.  Jodelle Red Charmeka [place] 06/19/2015,2:06 PM

## 2015-06-19 NOTE — Progress Notes (Signed)
  RD consulted for nutrition education regarding diabetes.   Lab Results  Component Value Date   HGBA1C 10.6* 06/18/2015    RD provided "Carbohydrate Counting for People with Diabetes" handout from the Academy of Nutrition and Dietetics. Discussed different food groups and their effects on blood sugar, emphasizing carbohydrate-containing foods. Provided list of carbohydrates and recommended serving sizes of common foods.  Discussed importance of controlled and consistent carbohydrate intake throughout the day. Provided examples of ways to balance meals/snacks and encouraged intake of high-fiber, whole grain complex carbohydrates. Teach back method used.  Expect good compliance.  Body mass index is 31.75 kg/(m^2).   Current diet order is carb modified, patient is consuming approximately 100% of meals at this time. Labs and medications reviewed. No further nutrition interventions warranted at this time. RD contact information provided. If additional nutrition issues arise, please re-consult RD.  Shaleena Crusoe B. Freida BusmanAllen, RD, LDN [phone] (pager)

## 2015-06-21 LAB — CULTURE, BLOOD (ROUTINE X 2)
Culture: NO GROWTH
Culture: NO GROWTH

## 2015-06-22 ENCOUNTER — Other Ambulatory Visit: Payer: Self-pay | Admitting: Internal Medicine

## 2015-06-22 DIAGNOSIS — R0600 Dyspnea, unspecified: Secondary | ICD-10-CM

## 2015-07-06 ENCOUNTER — Ambulatory Visit (INDEPENDENT_AMBULATORY_CARE_PROVIDER_SITE_OTHER): Payer: BLUE CROSS/BLUE SHIELD | Admitting: *Deleted

## 2015-07-06 DIAGNOSIS — J441 Chronic obstructive pulmonary disease with (acute) exacerbation: Secondary | ICD-10-CM

## 2015-07-06 DIAGNOSIS — R0600 Dyspnea, unspecified: Secondary | ICD-10-CM

## 2015-07-06 LAB — PULMONARY FUNCTION TEST
DL/VA % pred: 100 %
DL/VA: 5.3 ml/min/mmHg/L
DLCO UNC: 28.67 ml/min/mmHg
DLCO unc % pred: 96 %
FEF 25-75 Post: 0.7 L/sec
FEF 25-75 Pre: 1.61 L/sec
FEF2575-%Change-Post: -56 %
FEF2575-%Pred-Post: 21 %
FEF2575-%Pred-Pre: 48 %
FEV1-%CHANGE-POST: -37 %
FEV1-%PRED-PRE: 68 %
FEV1-%Pred-Post: 42 %
FEV1-POST: 1.44 L
FEV1-PRE: 2.32 L
FEV1FVC-%Change-Post: -37 %
FEV1FVC-%Pred-Pre: 82 %
FEV6-%Change-Post: 1 %
FEV6-%PRED-POST: 81 %
FEV6-%PRED-PRE: 80 %
FEV6-POST: 3.34 L
FEV6-PRE: 3.3 L
FEV6FVC-%CHANGE-POST: -2 %
FEV6FVC-%PRED-POST: 99 %
FEV6FVC-%PRED-PRE: 101 %
FVC-%Change-Post: 0 %
FVC-%PRED-POST: 81 %
FVC-%Pred-Pre: 82 %
FVC-POST: 3.41 L
FVC-Pre: 3.43 L
POST FEV6/FVC RATIO: 98 %
PRE FEV6/FVC RATIO: 100 %
Post FEV1/FVC ratio: 42 %
Pre FEV1/FVC ratio: 68 %
RV % PRED: 202 %
RV: 3.66 L
TLC % PRED: 123 %
TLC: 7 L

## 2015-07-06 NOTE — Progress Notes (Signed)
SMW performed today. 

## 2015-07-06 NOTE — Progress Notes (Signed)
PFT performed today. 

## 2015-07-08 ENCOUNTER — Encounter: Payer: Self-pay | Admitting: Internal Medicine

## 2015-07-08 ENCOUNTER — Ambulatory Visit (INDEPENDENT_AMBULATORY_CARE_PROVIDER_SITE_OTHER): Payer: BLUE CROSS/BLUE SHIELD | Admitting: Internal Medicine

## 2015-07-08 VITALS — BP 112/64 | HR 83 | Ht 68.0 in | Wt 205.2 lb

## 2015-07-08 DIAGNOSIS — J4540 Moderate persistent asthma, uncomplicated: Secondary | ICD-10-CM | POA: Diagnosis not present

## 2015-07-08 MED ORDER — ALBUTEROL SULFATE HFA 108 (90 BASE) MCG/ACT IN AERS: 2.0000 | INHALATION_SPRAY | Freq: Four times a day (QID) | RESPIRATORY_TRACT | Status: AC | PRN

## 2015-07-08 MED ORDER — ALBUTEROL SULFATE HFA 108 (90 BASE) MCG/ACT IN AERS: 2.0000 | INHALATION_SPRAY | Freq: Four times a day (QID) | RESPIRATORY_TRACT | Status: DC | PRN

## 2015-07-08 NOTE — Patient Instructions (Signed)
--  Stop dulera, use ventolin as needed.  --Move pet out of bedroom.  --Use claritin during times when your allergies flare.  --Congrats on quitting smoking! Keep it going!

## 2015-07-08 NOTE — Progress Notes (Signed)
[facility]     Assessment and Plan:  Acute asthma exacerbation. -The patient was recently discharged from the hospital with acute exacerbation of asthma, she was discharged on a prednisone taper. Symptoms are now resolved.  -Given the patient has a new dog, recommended that the PET be removed from the bedroom  --Stop dulera, use ventolin prn.  --Use claritin when notices that allergies are flaring.   --PFT suggested COPD, but this could be residual from her recent pneumonia. Recommend repeating in the future. Will check Alpha-1 today.    Pneumonia.  -Multifocal pneumonia BCx2 11/1: Negative Mycoplasma IgM: 11/1 . Negative Streptococcal urinary antigen: 11/1 . Negative Influenza swab: 11/1. Negative    Date: 07/08/2015  MRN# [id number] Gina Guzman September 28, 1975   Gina Guzman is a 39 y.o. old female seen in follow up for chief complaint of  Chief Complaint  Patient presents with  . Follow-up    SMW & PFT results. no new concerns.      HPI:   The patient is a 39 year old female who was admitted to the hospital recently with pneumonia and acute exacerbation of asthma. She was discharged home with a course of steroids and antibiotics. She appeared to have significant allergic response. Therefore, she was asked to take Claritin once a day, and remove the dog from her bedroom. He was noted at the time that the patient's allergic symptoms seem to get worse after getting a new dog recently.   She is now back to work and feels that her breathing is back to normal. She is using dulera prn. She completed the spiriva. She has not used the proair.  She still has the dog in the bedroom. She is no longer on claritin.   She is no longer smoking and notes that her breathing is better.   She notes that she snores, she is not sleepy during the day, she feels rested in the am. No witnessed apneas.   Review of testing: -Pulmonary function test from  07/06/2015: Moderate obstruction with an FEV1 of 68%. No reversibility with bronchodilator therapy. Moderate to severe air trapping. Normal DLCO. -6 minute walk test from 07/06/2015: Patient's beginning O2 sat on room air was 95% at the end of the test was 95% on room air. Unchanged after 2 minutes post test.  SIX MIN WALK 07/06/2015  Medications Aleve D, Chantix, Novolog  Supplimental Oxygen during Test? (L/min) No  Laps 8  Partial Lap (in Meters) 0  Baseline BP (sitting) 120/72  Baseline Heartrate 99  Baseline Dyspnea (Borg Scale) 0.5  Baseline Fatigue (Borg Scale) 0  Baseline SPO2 95  BP (sitting) 136/86  Heartrate 117  Dyspnea (Borg Scale) 0.5  Fatigue (Borg Scale) 0  SPO2 95  BP (sitting) 130/82  Heartrate 105  SPO2 95  Stopped or Paused before Six Minutes No  Distance Completed 384  Tech Comments: Pt walked at moderate pace.    Medication:   Outpatient Encounter Prescriptions as of 07/08/2015  Medication Sig  . albuterol (PROAIR HFA) 108 (90 BASE) MCG/ACT inhaler Inhale 2 puffs into the lungs every 6 (six) hours as needed for wheezing.  Marland Kitchen amoxicillin-clavulanate (AUGMENTIN) 875-125 MG tablet Take 1 tablet by mouth every 12 (twelve) hours.  . insulin aspart (NOVOLOG) 100 UNIT/ML injection Inject 8 Units into the skin 3 (three) times daily with meals.  . insulin glargine (LANTUS) 100 UNIT/ML injection Inject 0.2 mLs (20 Units total) into the skin daily.  . mometasone-formoterol (DULERA)  100-5 MCG/ACT AERO Inhale 2 puffs into the lungs 2 (two) times daily.  . nicotine (NICOTROL) 10 MG inhaler Inhale 1 cartridge (1 continuous puffing total) into the lungs as needed for smoking cessation.  . predniSONE (DELTASONE) 5 MG tablet 6 tabs day1; 5 tabs day2; 4 tabs day3; 3 tabs day4; 2 tabs day5; 1 tab day6,7   No facility-administered encounter medications on file as of 07/08/2015.     Allergies:  Review of patient's allergies indicates no known allergies.  Review of  Systems: Gen:  Denies  fever, sweats. HEENT: Denies blurred vision. Cvc:  No dizziness, chest pain or heaviness Resp:   Denies cough or sputum porduction. Gi: Denies swallowing difficulty, stomach pain. constipation, bowel incontinence Gu:  Denies bladder incontinence, burning urine Ext:   No Joint pain, stiffness. Skin: No skin rash, easy bruising. Endoc:  No polyuria, polydipsia. Psych: No depression, insomnia. Other:  All other systems were reviewed and found to be negative other than what is mentioned in the HPI.   Physical Examination:   VS: LMP  (LMP Unknown)  General Appearance: No distress  Neuro:without focal findings,  speech normal,  HEENT: PERRLA, EOM intact. Pulmonary: normal breath sounds, No wheezing.   CardiovascularNormal S1,S2.  No m/r/g.   Abdomen: Benign, Soft, non-tender. Renal:  No costovertebral tenderness  GU:  Not performed at this time. Endoc: No evident thyromegaly, no signs of acromegaly. Skin:   warm, no rash. Extremities: normal, no cyanosis, clubbing.   LABORATORY PANEL:   CBC No results for input(s): WBC, HGB, HCT, PLT in the last 168 hours. ------------------------------------------------------------------------------------------------------------------  Chemistries  No results for input(s): NA, K, CL, CO2, GLUCOSE, BUN, CREATININE, CALCIUM, MG, AST, ALT, ALKPHOS, BILITOT in the last 168 hours.  Invalid input(s): GFRCGP ------------------------------------------------------------------------------------------------------------------  Cardiac Enzymes No results for input(s): TROPONINI in the last 168 hours. ------------------------------------------------------------  RADIOLOGY:   No results found for this or any previous visit. Results for orders placed during the hospital encounter of 06/16/15  DG Chest 2 View   Narrative CLINICAL DATA:  Feeling bad for the past 6 weeks. Intermittent low saturations. History of  smoking.  EXAM: CHEST  2 VIEW  COMPARISON:  None.  FINDINGS: Normal cardiac silhouette and mediastinal contours. Veiling opacities overlying the bilateral lower lungs favored to represent lying breast tissues. The lungs appear mildly hyperexpanded with mild diffuse slightly nodular thickening of the pulmonary interstitium. Minimal bibasilar linear heterogeneous opacities, left greater than right, likely atelectasis or scar. No discrete focal airspace opacities. No pleural effusion or pneumothorax. No evidence of edema. No acute osseus abnormalities.  IMPRESSION: Mild lung hyper expansion and bronchitic change without acute cardiopulmonary disease.   Electronically Signed   By: Simonne ComeJohn  Watts M.D.   On: 06/16/2015 10:50    ------------------------------------------------------------------------------------------------------------------  Thank  you for allowing [facility] to assist in the care of your patient. Our recommendations are noted above.  Please contact us if we can be of further service.   Wells Guileseep Jadda Hunsucker, MD.  [facility] Office Number: [phone]  Santiago Gladavid Kasa, M.D.  Stephanie AcreVishal Mungal, M.D.  Billy Fischeravid Simonds, M.D

## 2015-09-22 ENCOUNTER — Other Ambulatory Visit: Payer: Self-pay | Admitting: Internal Medicine

## 2015-09-22 DIAGNOSIS — Z1231 Encounter for screening mammogram for malignant neoplasm of breast: Secondary | ICD-10-CM

## 2015-09-23 ENCOUNTER — Ambulatory Visit
Admission: RE | Admit: 2015-09-23 | Discharge: 2015-09-23 | Disposition: A | Discharge status: Home / Self Care | Payer: BLUE CROSS/BLUE SHIELD | Source: Ambulatory Visit | Attending: Internal Medicine | Admitting: Internal Medicine

## 2015-09-23 DIAGNOSIS — Z1231 Encounter for screening mammogram for malignant neoplasm of breast: Secondary | ICD-10-CM | POA: Insufficient documentation

## 2015-09-30 ENCOUNTER — Other Ambulatory Visit: Payer: Self-pay | Admitting: Internal Medicine

## 2015-09-30 DIAGNOSIS — R928 Other abnormal and inconclusive findings on diagnostic imaging of breast: Secondary | ICD-10-CM

## 2015-10-12 ENCOUNTER — Ambulatory Visit
Admission: RE | Admit: 2015-10-12 | Discharge: 2015-10-12 | Disposition: A | Discharge status: Home / Self Care | Payer: BLUE CROSS/BLUE SHIELD | Source: Ambulatory Visit | Attending: Internal Medicine | Admitting: Internal Medicine

## 2015-10-12 DIAGNOSIS — R928 Other abnormal and inconclusive findings on diagnostic imaging of breast: Secondary | ICD-10-CM | POA: Diagnosis present

## 2016-09-15 IMAGING — CT CT ANGIO CHEST
1 of 2 series · 18 of 30 positions shown · IV contrast (omnipaque)
Comparison: Chest x-ray earlier today.

CLINICAL DATA: Asthma and progressive shortness of breath.

EXAM:
CT ANGIOGRAPHY CHEST WITH CONTRAST
TECHNIQUE: Multidetector CT imaging of the chest was performed using the
standard protocol during bolus administration of intravenous
contrast. Multiplanar CT image reconstructions and MIPs were
obtained to evaluate the vascular anatomy.
CONTRAST:  75mL OMNIPAQUE IOHEXOL 350 MG/ML SOLN

[Series 5: pe 1.0 thins · axial · 0.68mm/px · z∈[-234,+32]mm · 18 of 299 slices shown]
[im 17/299  lung]
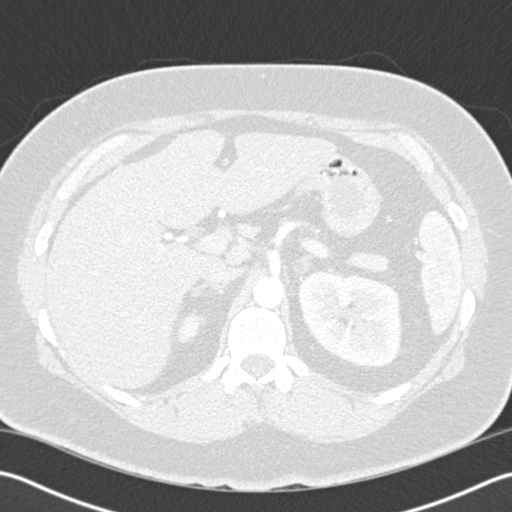
[im 34/299  mediastinal]
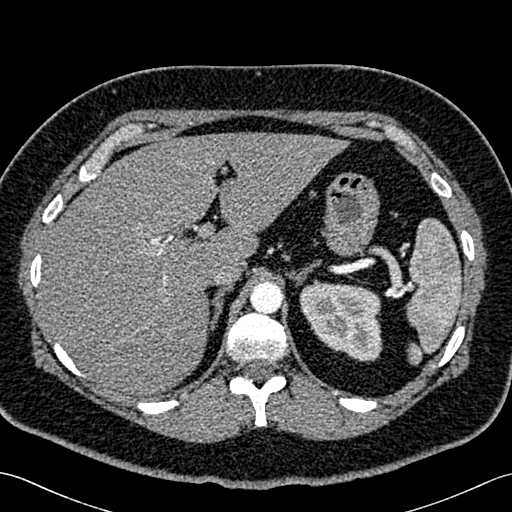
[im 50/299  lung]
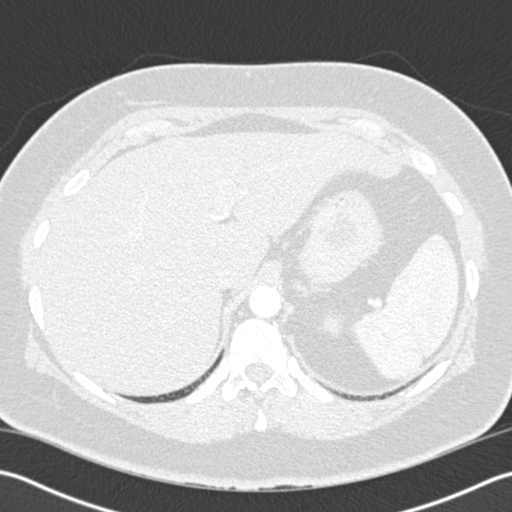
[im 67/299  mediastinal]
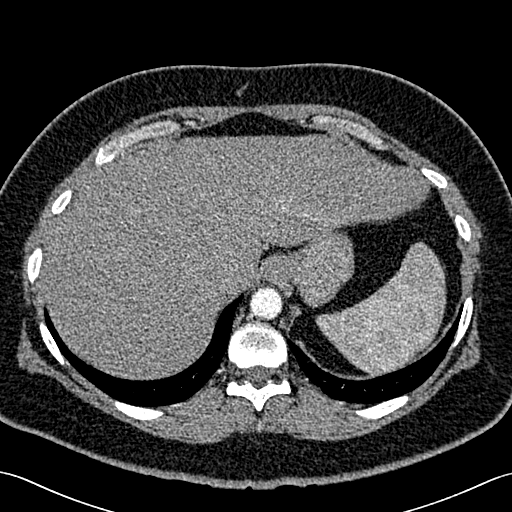
[im 83/299  lung]
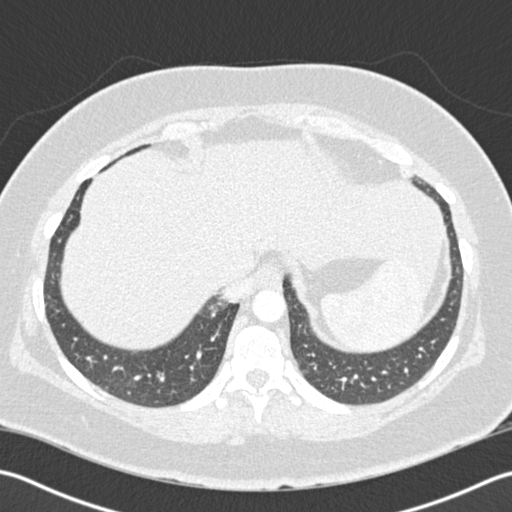
[im 100/299  mediastinal]
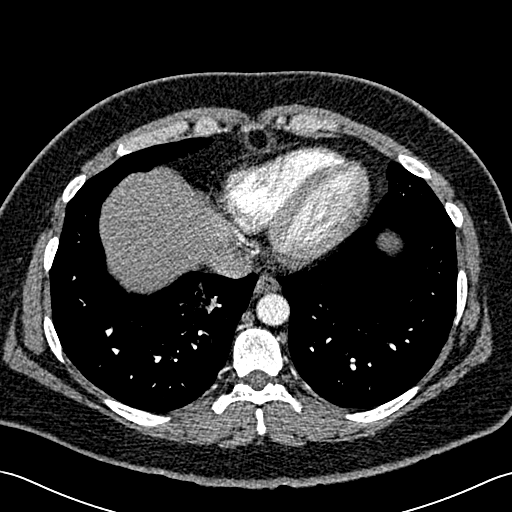
[im 116/299  lung]
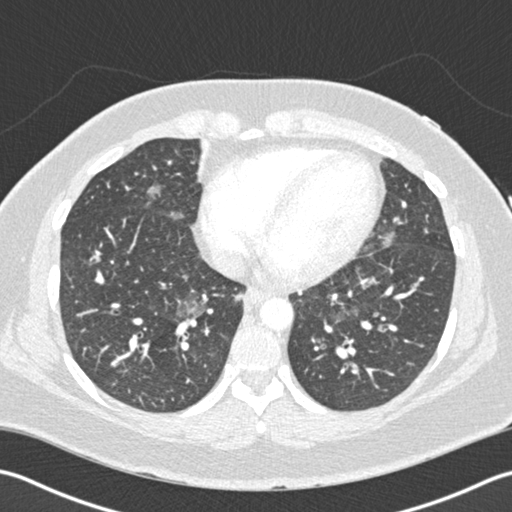
[im 133/299  mediastinal]
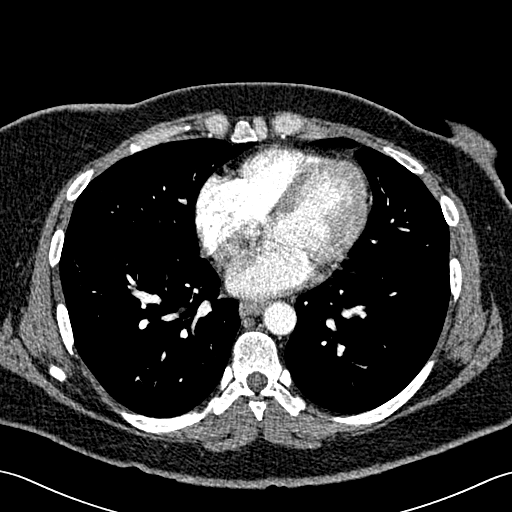
[im 140/299  lung]
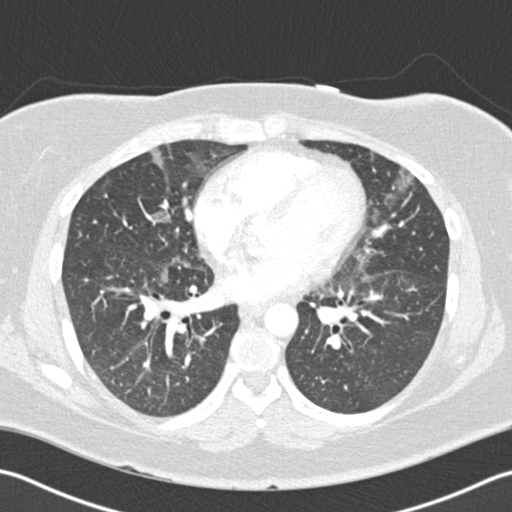
[im 150/299  mediastinal]
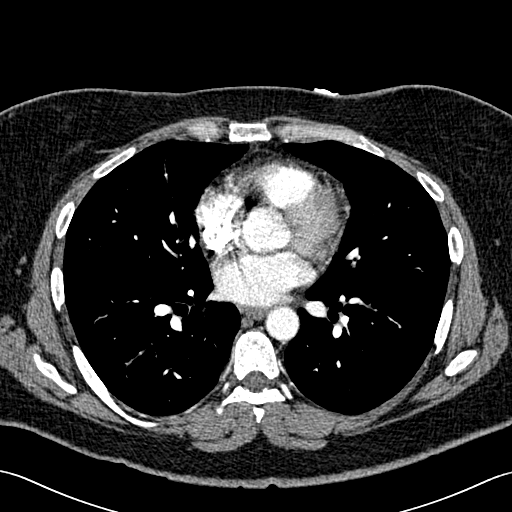
[im 166/299  lung]
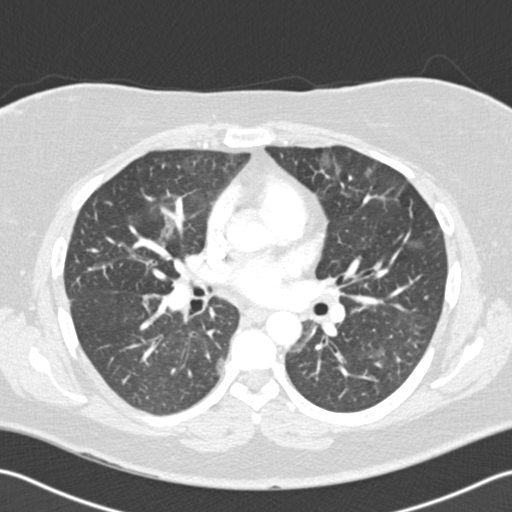
[im 183/299  mediastinal]
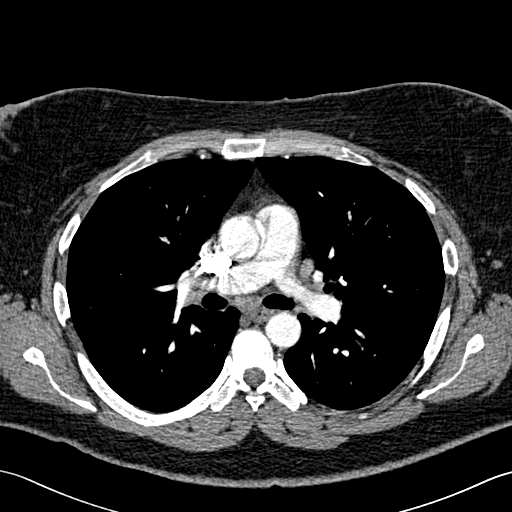
[im 199/299  lung]
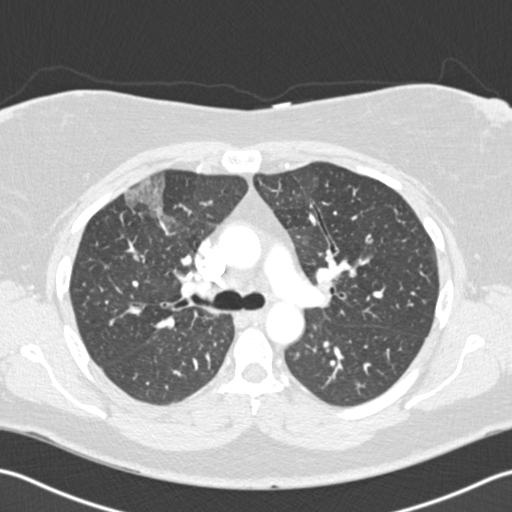
[im 216/299  mediastinal]
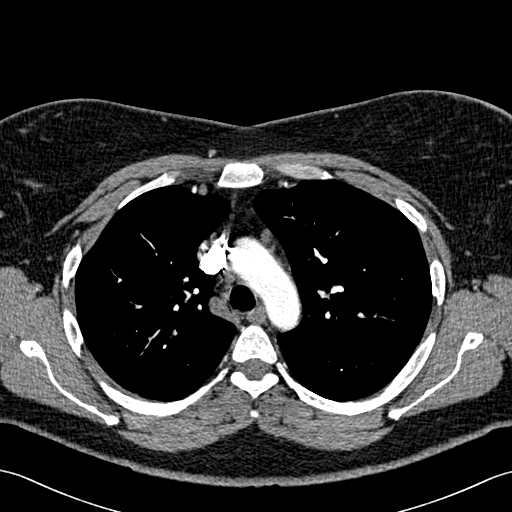
[im 232/299  lung]
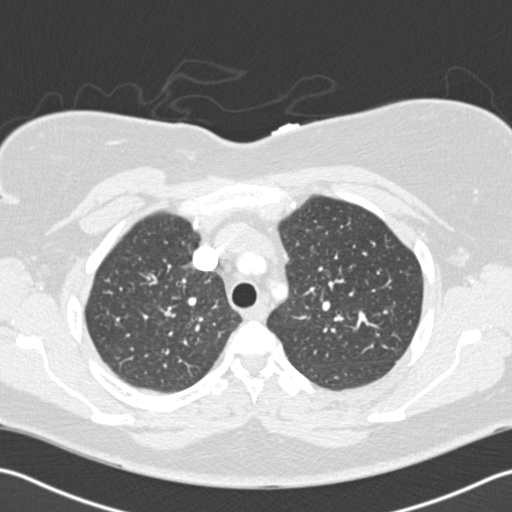
[im 249/299  mediastinal]
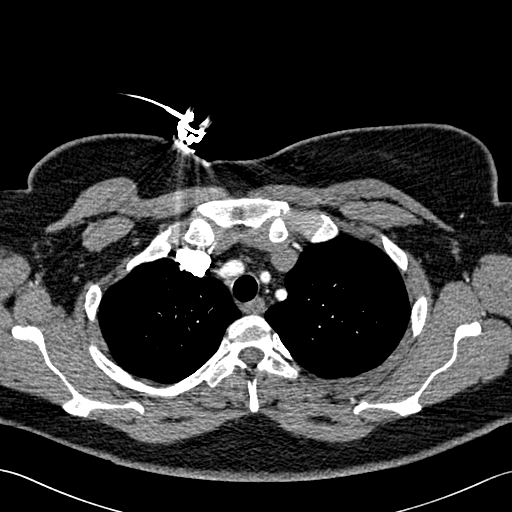
[im 265/299  lung]
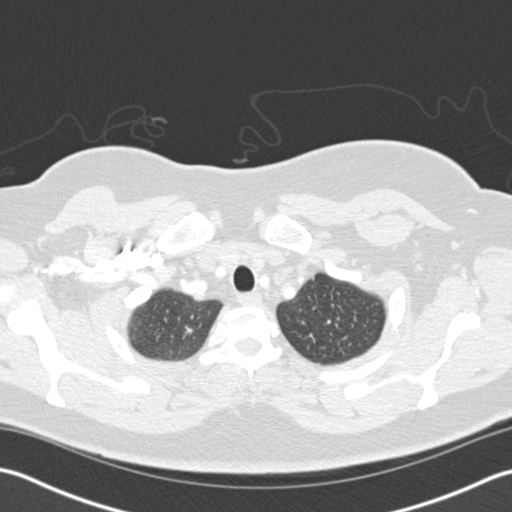
[im 282/299  mediastinal]
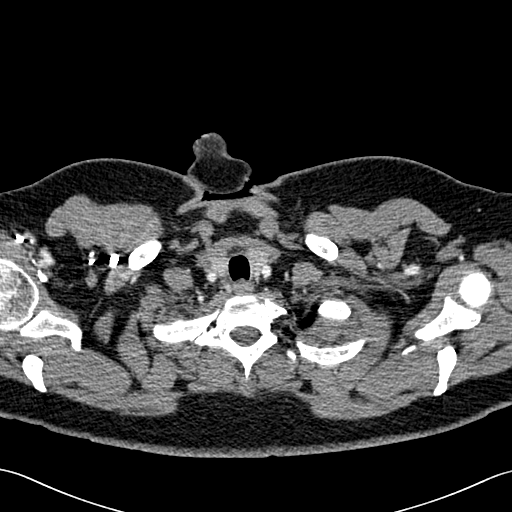

[18 of 30 positions shown; findings below may reference images not displayed]

FINDINGS: Patchy areas of ground-glass airspace opacity in both upper lobes,
the right middle lobe and lingula as well as mild involvement at the
lung bases in the lower lobes bilaterally is suggestive of
multifocal pneumonia. No edema, nodule, pneumothorax or pleural
effusion is identified. No evidence of airway obstruction.

No pericardial fluid is seen. The heart size is normal. The
pulmonary arteries are well opacified and there is no evidence of
pulmonary embolism. The thoracic aorta is normal. Visualized upper
abdominal structures show probable hepatic steatosis. Bony
structures are unremarkable.

Review of the MIP images confirms the above findings.
IMPRESSION: Patchy ground-glass infiltrates bilaterally predominantly and upper
and mid lung zones and consistent with multifocal pneumonia. No
associated edema, pleural fluid or pneumothorax. No pulmonary
embolism.

## 2017-02-23 ENCOUNTER — Other Ambulatory Visit: Payer: Self-pay | Admitting: Internal Medicine

## 2017-02-23 DIAGNOSIS — R7989 Other specified abnormal findings of blood chemistry: Secondary | ICD-10-CM

## 2017-02-23 DIAGNOSIS — R945 Abnormal results of liver function studies: Secondary | ICD-10-CM

## 2017-03-16 ENCOUNTER — Ambulatory Visit
Admission: RE | Admit: 2017-03-16 | Discharge: 2017-03-16 | Disposition: A | Discharge status: Home / Self Care | Payer: BLUE CROSS/BLUE SHIELD | Source: Ambulatory Visit | Attending: Internal Medicine | Admitting: Internal Medicine

## 2017-03-16 DIAGNOSIS — R945 Abnormal results of liver function studies: Secondary | ICD-10-CM | POA: Insufficient documentation

## 2017-03-16 DIAGNOSIS — K769 Liver disease, unspecified: Secondary | ICD-10-CM | POA: Diagnosis not present

## 2017-03-16 DIAGNOSIS — R7989 Other specified abnormal findings of blood chemistry: Secondary | ICD-10-CM

## 2018-07-24 IMAGING — US US ABDOMEN LIMITED
1 series · 14 of 25 positions shown · non-contrast
Comparison: None.

CLINICAL DATA: Abnormal liver function tests

EXAM:
ULTRASOUND ABDOMEN LIMITED RIGHT UPPER QUADRANT

[Series 1: us abdomen limited · 0.23mm/px · 14 of 67 slices shown]
[im 1/67]
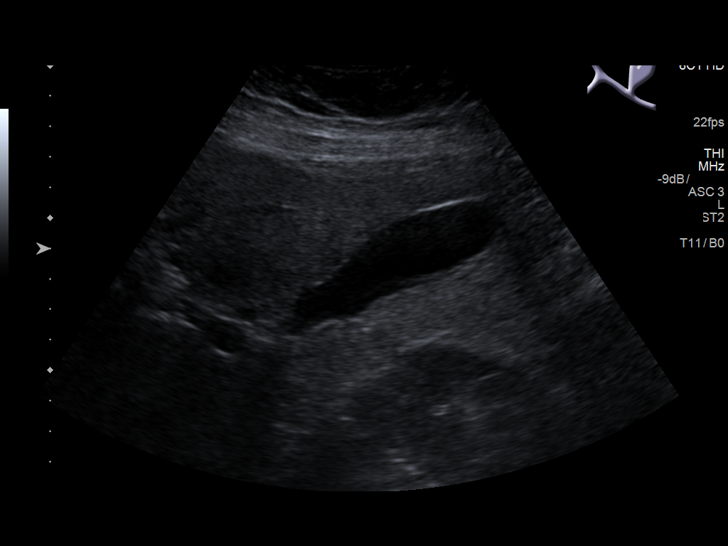
[im 6/67]
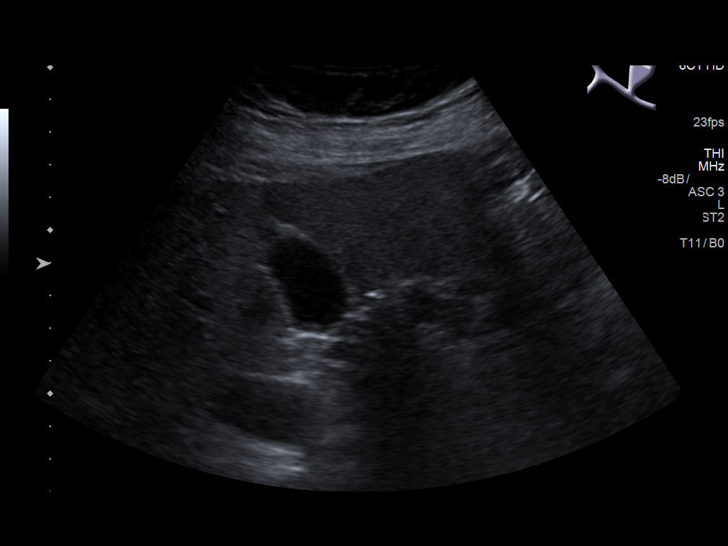
[im 12/67]
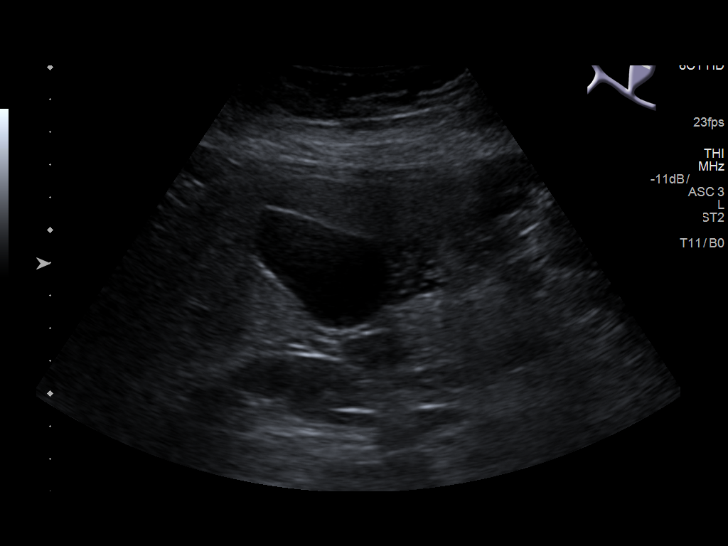
[im 17/67]
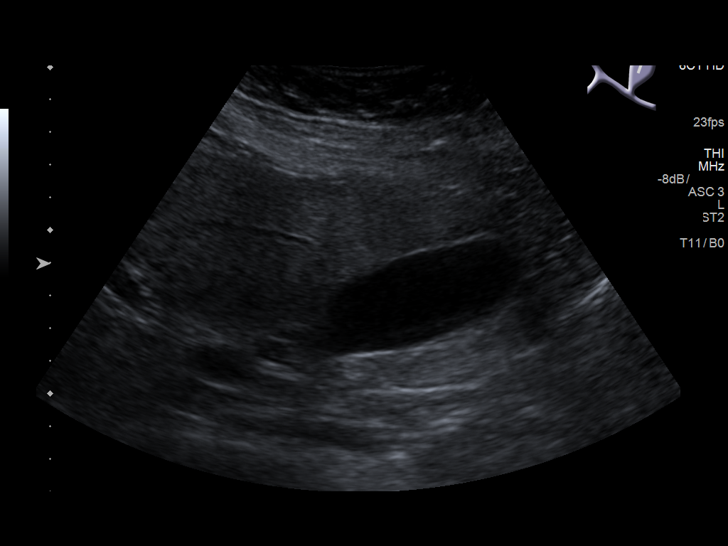
[im 23/67]
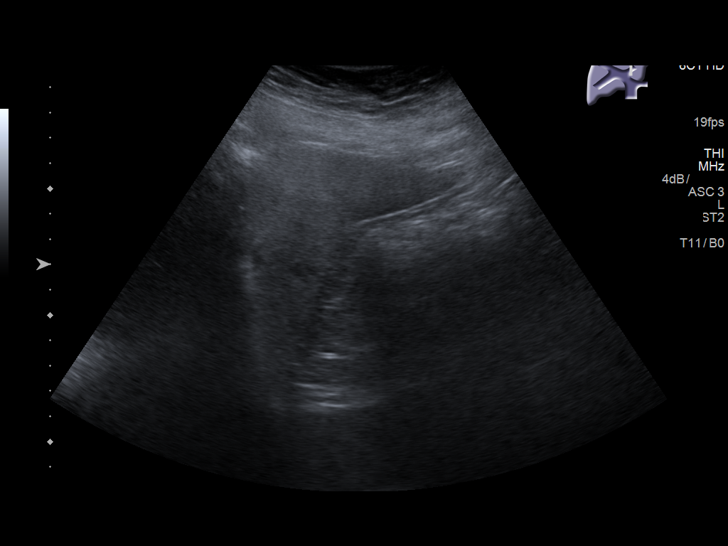
[im 25/67]
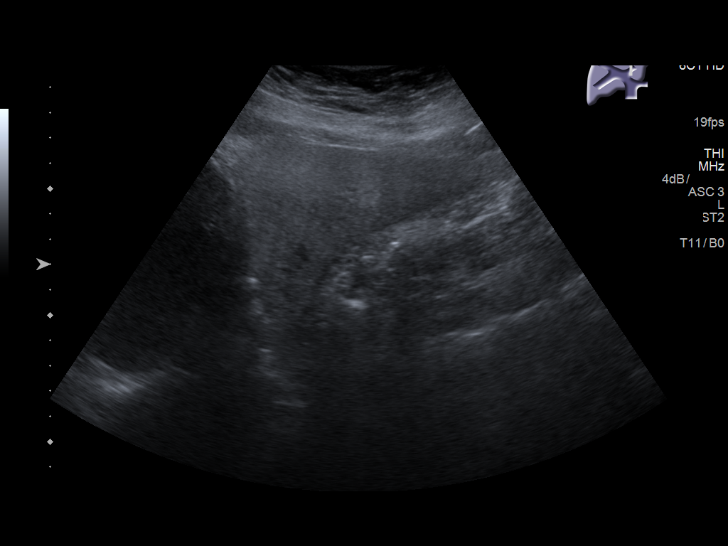
[im 31/67]
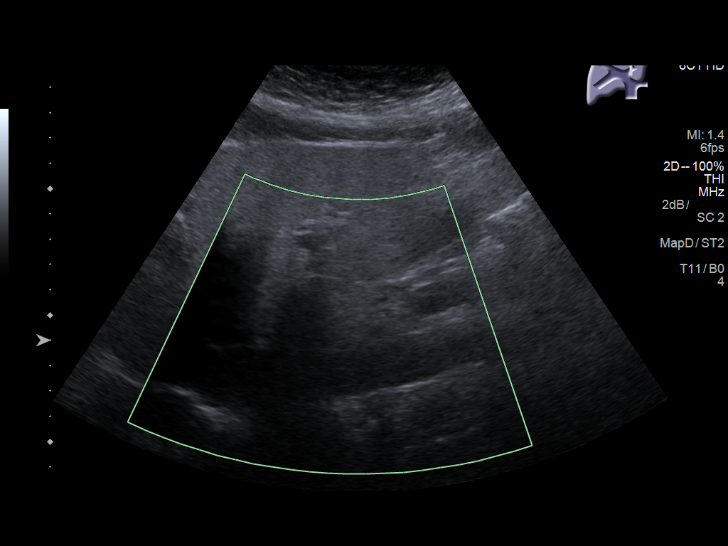
[im 36/67]
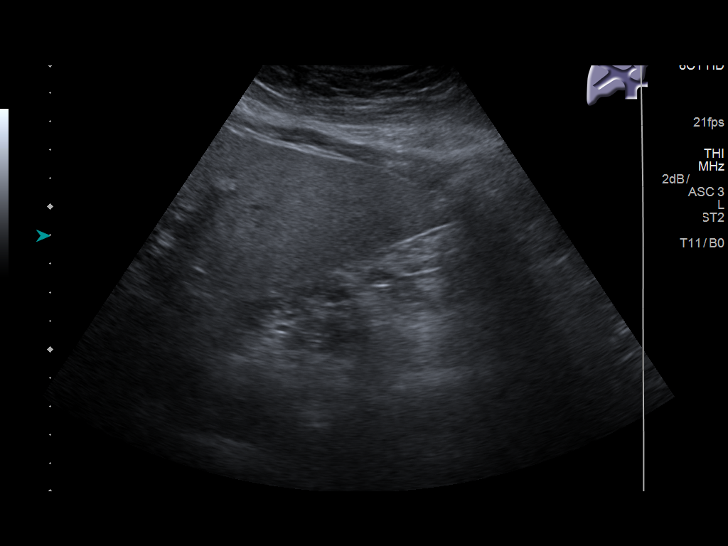
[im 42/67]
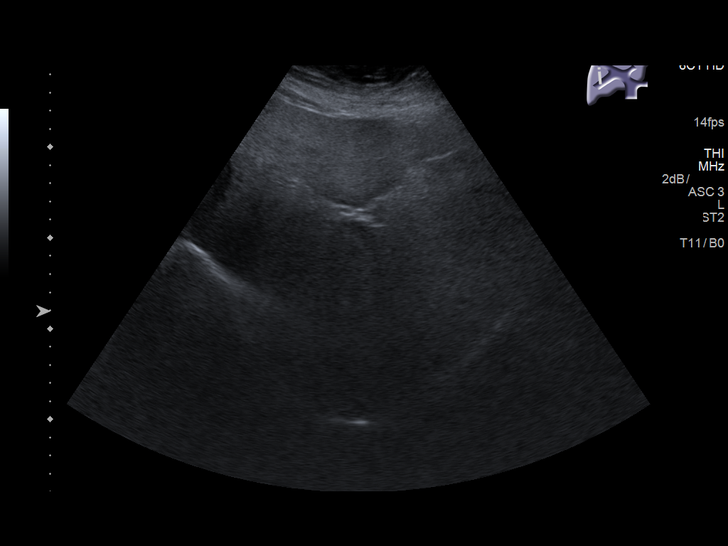
[im 45/67]
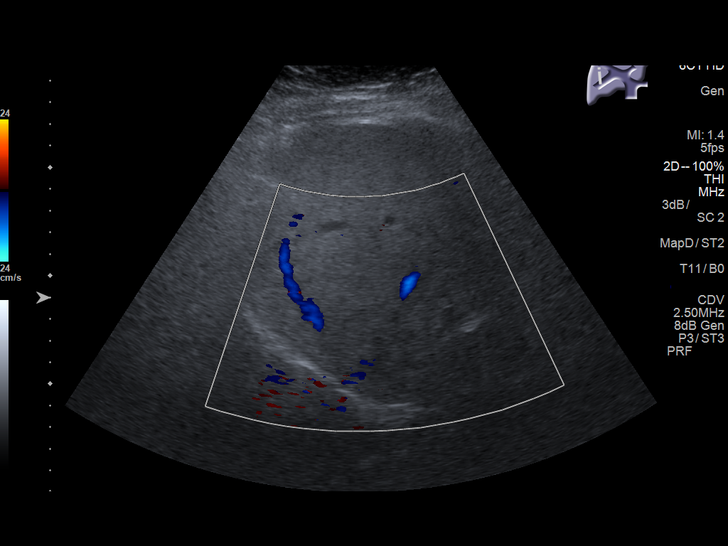
[im 50/67]
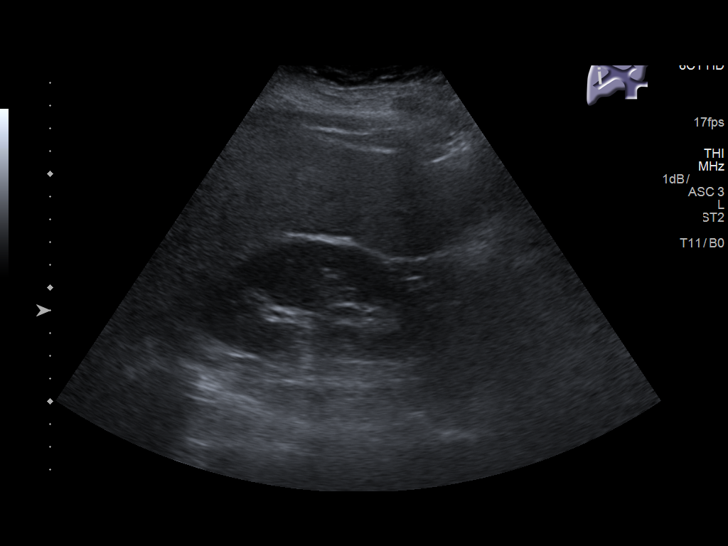
[im 56/67]
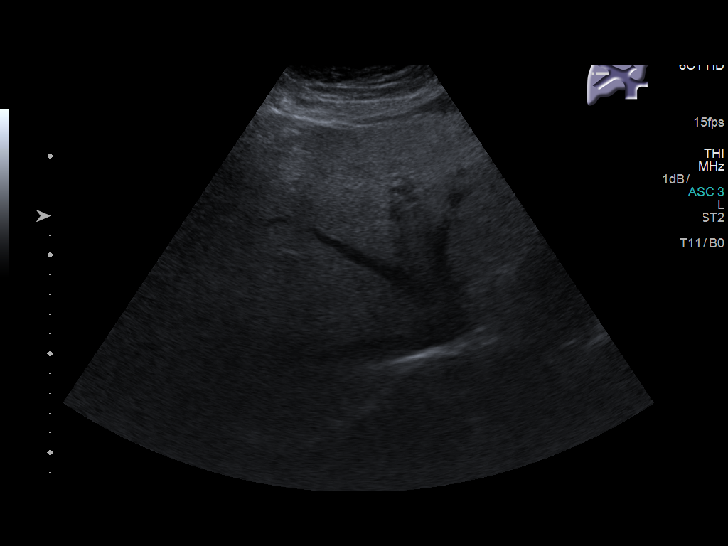
[im 61/67]
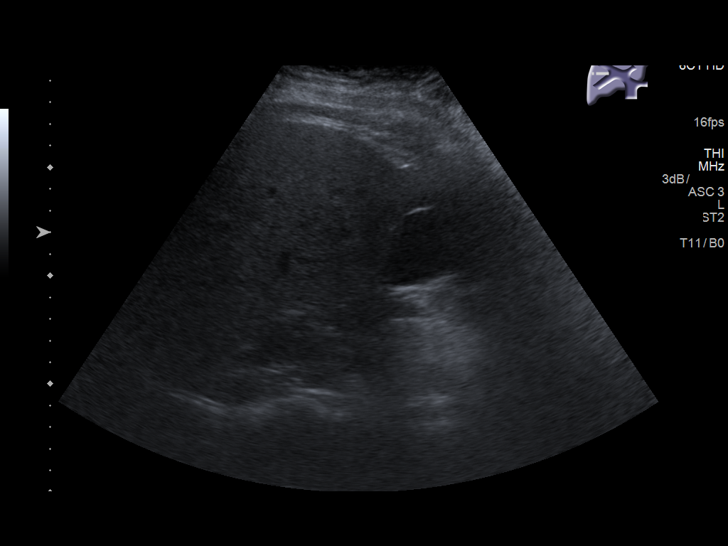
[im 67/67]
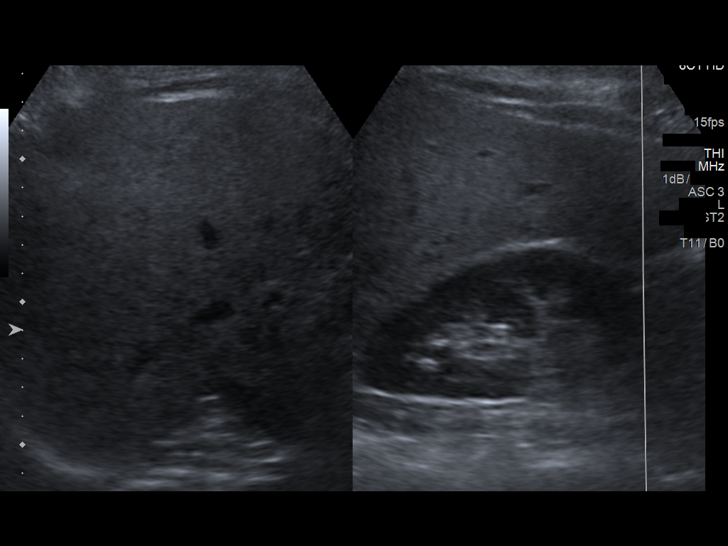

[14 of 25 positions shown; findings below may reference images not displayed]

FINDINGS: Gallbladder:

The gallbladder is visualized and no gallstones are noted. There is
no pain the gallbladder with compression.

Common bile duct:

Diameter: The common bile duct is normal measuring 5 mm in diameter.

Liver:

The liver is echogenic and inhomogeneous consistent with diffuse
fatty infiltration. No focal hepatic abnormality is seen.
IMPRESSION: 1. Echogenic inhomogeneous liver parenchyma most consistent with
diffuse fatty infiltration.
2. No gallstones.

## 2019-11-10 ENCOUNTER — Ambulatory Visit: Payer: BLUE CROSS/BLUE SHIELD | Attending: Internal Medicine

## 2019-11-10 DIAGNOSIS — Z23 Encounter for immunization: Secondary | ICD-10-CM

## 2019-11-10 NOTE — Progress Notes (Signed)
   Covid-[facility] Vaccination [facility]  Name:  Gina Guzman    MRN: [id number] DOB: 06-Oct-1975  11/10/2019  [name] was observed post Covid-19 immunization for 15 minutes without incident. She was provided with Vaccine Information Sheet and instruction to access the V-Safe system.   [name] was instructed to call 911 with any severe reactions post vaccine: Marland Kitchen Difficulty breathing  . Swelling of face and throat  . A fast heartbeat  . A bad rash all over body  . Dizziness and weakness   Immunizations Administered    Name Date Dose VIS Date Route   Pfizer COVID-19 Vaccine 11/10/2019  2:56 PM 0.3 mL 07/26/2019 Intramuscular   Manufacturer: [employer], [employer]   Lot: [id number]   NDC: [address]-5

## 2019-12-03 ENCOUNTER — Ambulatory Visit: Payer: BC Managed Care – PPO | Attending: Internal Medicine

## 2019-12-03 DIAGNOSIS — Z23 Encounter for immunization: Secondary | ICD-10-CM

## 2019-12-03 NOTE — Progress Notes (Signed)
   Covid-19 Vaccination Clinic  Name:  ELANE PEABODY    MRN: [id number] DOB: 1975/10/29  12/03/2019  Ms. Vandenberghe was observed post Covid-19 immunization for 15 minutes without incident. She was provided with Vaccine Information Sheet and instruction to access the V-Safe system.   Ms. Tsan was instructed to call 911 with any severe reactions post vaccine: Marland Kitchen Difficulty breathing  . Swelling of face and throat  . A fast heartbeat  . A bad rash all over body  . Dizziness and weakness   Immunizations Administered    Name Date Dose VIS Date Route   Pfizer COVID-19 Vaccine 12/03/2019  3:15 PM 0.3 mL 10/09/2018 Intramuscular   Manufacturer: [employer], [employer]   Lot: [id number]   NDC: [address]-5

## 2020-06-05 ENCOUNTER — Other Ambulatory Visit: Payer: Self-pay | Admitting: Internal Medicine

## 2020-06-05 DIAGNOSIS — Z1231 Encounter for screening mammogram for malignant neoplasm of breast: Secondary | ICD-10-CM
